# Patient Record
Sex: Male | Born: 1975 | Race: White | Hispanic: No | Marital: Single | Smoking: Light tobacco smoker
Health system: Southern US, Community
[De-identification: ages and names within clinical notes are randomized; demographics above are authoritative.]

---

## 2006-11-09 ENCOUNTER — Emergency Department (HOSPITAL_COMMUNITY): Admission: EM | Admit: 2006-11-09 | Discharge: 2006-11-09 | Payer: Self-pay | Admitting: Family Medicine

## 2018-04-20 ENCOUNTER — Other Ambulatory Visit: Payer: Self-pay | Admitting: Gerontology

## 2018-04-20 ENCOUNTER — Ambulatory Visit (INDEPENDENT_AMBULATORY_CARE_PROVIDER_SITE_OTHER): Payer: Self-pay

## 2018-04-20 DIAGNOSIS — R52 Pain, unspecified: Secondary | ICD-10-CM

## 2018-04-20 DIAGNOSIS — M79671 Pain in right foot: Secondary | ICD-10-CM

## 2018-04-20 DIAGNOSIS — M25571 Pain in right ankle and joints of right foot: Secondary | ICD-10-CM

## 2018-05-07 ENCOUNTER — Other Ambulatory Visit: Payer: Self-pay | Admitting: Emergency Medicine

## 2018-05-07 ENCOUNTER — Ambulatory Visit (INDEPENDENT_AMBULATORY_CARE_PROVIDER_SITE_OTHER): Payer: Self-pay

## 2018-05-07 DIAGNOSIS — M25571 Pain in right ankle and joints of right foot: Secondary | ICD-10-CM

## 2018-05-07 DIAGNOSIS — R52 Pain, unspecified: Secondary | ICD-10-CM

## 2018-05-07 DIAGNOSIS — M79671 Pain in right foot: Secondary | ICD-10-CM

## 2018-05-07 DIAGNOSIS — M7989 Other specified soft tissue disorders: Secondary | ICD-10-CM

## 2019-07-26 IMAGING — DX DG ANKLE COMPLETE 3+V*R*
3 series · 3 of 3 positions shown · non-contrast
Comparison: 04/20/2018

CLINICAL DATA: RIGHT ankle pain and swelling after twist injury 1
week ago

EXAM:
RIGHT ANKLE - COMPLETE 3+ VIEW

[ankle ap]
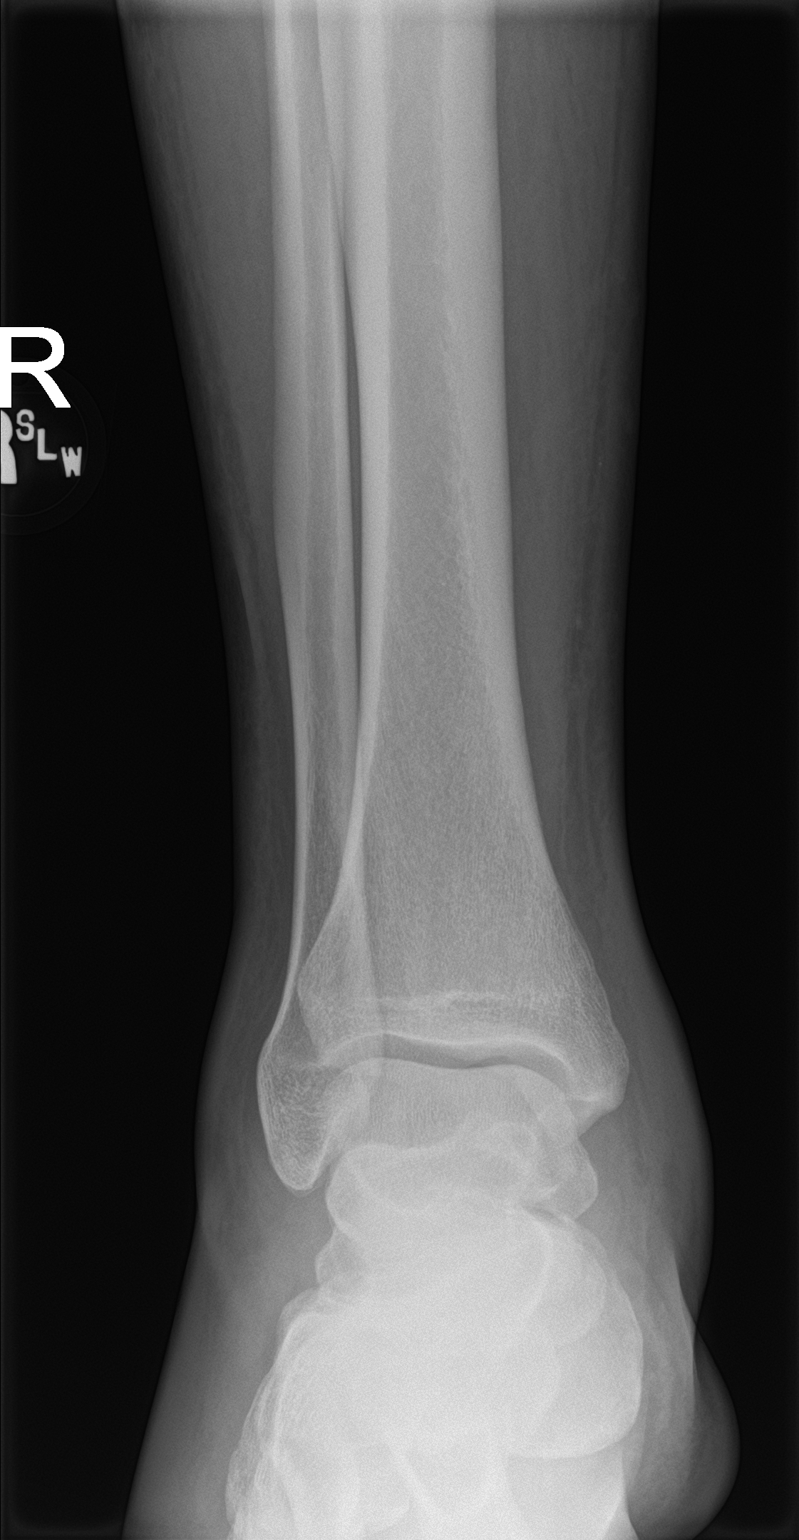

[ankle obl]
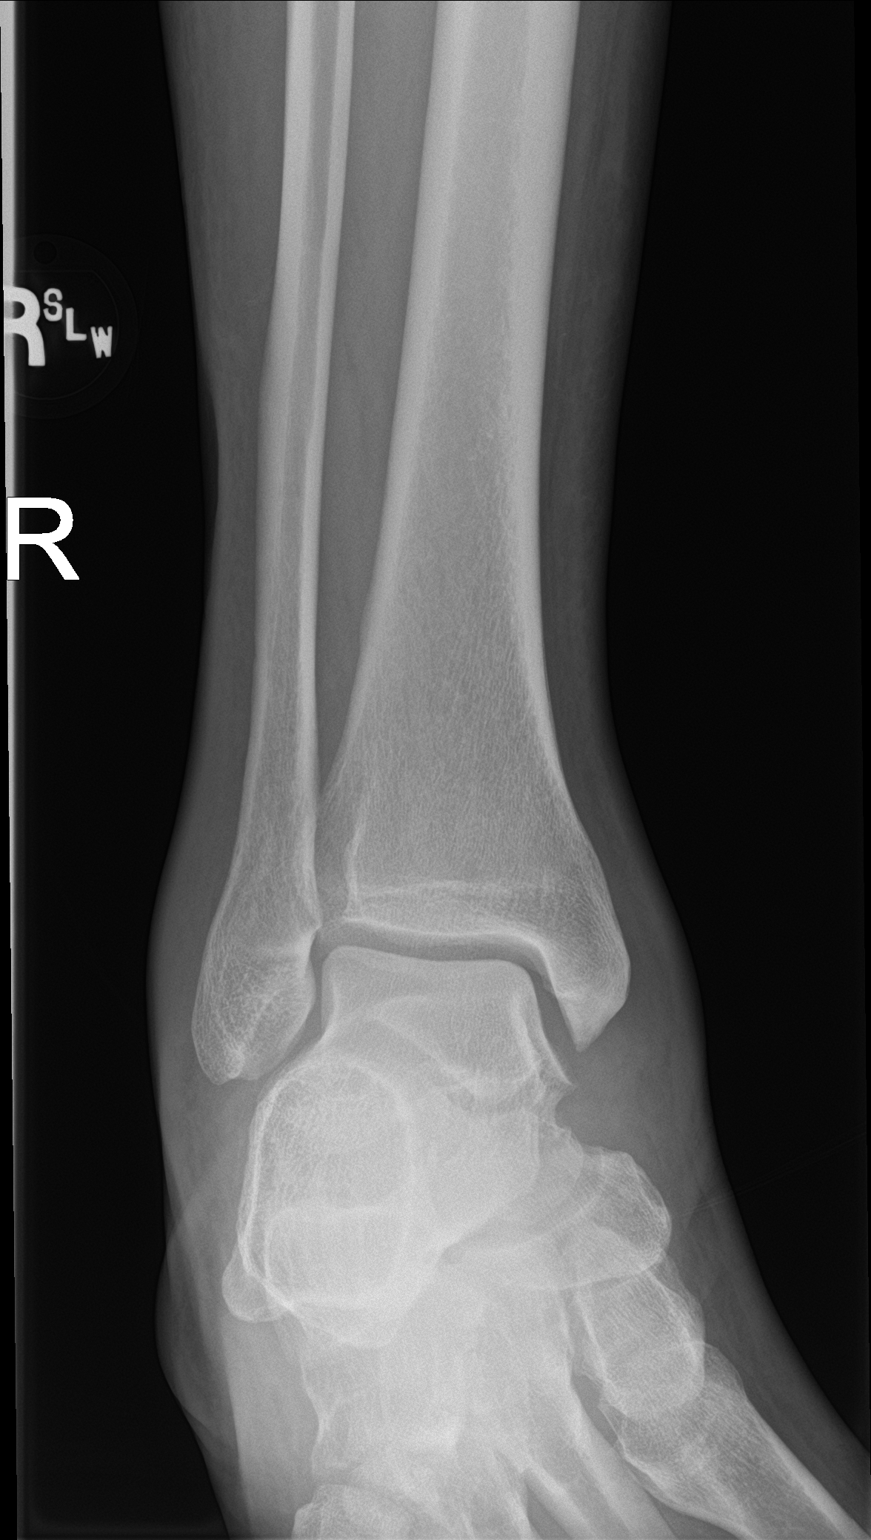

[ankle lat]
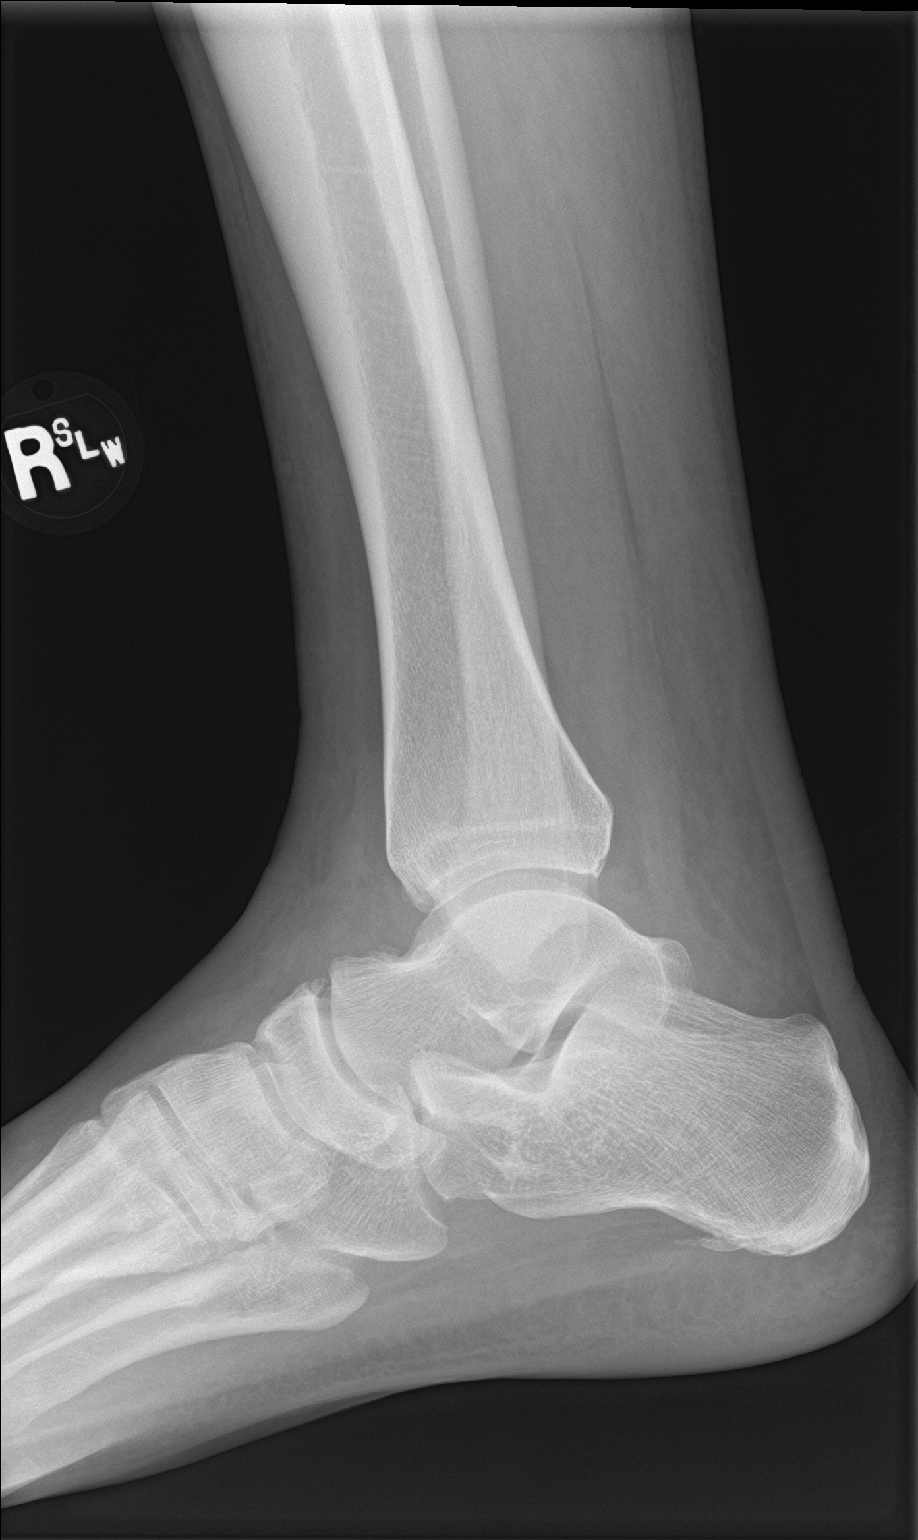

[3 of 3 positions shown; findings below may reference images not displayed]

FINDINGS: Diffuse soft tissue swelling of the RIGHT lower leg and ankle,
increased.

Osseous mineralization normal.

Joint spaces preserved.

Small plantar calcaneal spur.

No acute fracture, dislocation, or bone destruction.
IMPRESSION: No acute osseous abnormalities.

Increased soft tissue swelling since 04/20/2018.

## 2020-05-06 ENCOUNTER — Other Ambulatory Visit: Payer: Self-pay

## 2020-05-06 DIAGNOSIS — Z20822 Contact with and (suspected) exposure to covid-19: Secondary | ICD-10-CM

## 2020-05-08 LAB — NOVEL CORONAVIRUS, NAA: SARS-CoV-2, NAA: NOT DETECTED

## 2020-05-08 LAB — SARS-COV-2, NAA 2 DAY TAT

## 2020-07-28 ENCOUNTER — Encounter: Payer: Self-pay | Admitting: Oncology

## 2020-07-28 ENCOUNTER — Telehealth: Payer: Self-pay | Admitting: Oncology

## 2020-07-28 ENCOUNTER — Other Ambulatory Visit: Payer: Self-pay | Admitting: Oncology

## 2020-07-28 ENCOUNTER — Ambulatory Visit (HOSPITAL_COMMUNITY)
Admission: RE | Admit: 2020-07-28 | Discharge: 2020-07-28 | Disposition: A | Discharge status: Home / Self Care | Payer: Medicaid Other | Source: Ambulatory Visit | Attending: Pulmonary Disease | Admitting: Pulmonary Disease

## 2020-07-28 DIAGNOSIS — U071 COVID-19: Secondary | ICD-10-CM

## 2020-07-28 MED ORDER — EPINEPHRINE 0.3 MG/0.3ML IJ SOAJ: 0.3000 mg | Freq: Once | INTRAMUSCULAR | Status: DC | PRN

## 2020-07-28 MED ORDER — ALBUTEROL SULFATE HFA 108 (90 BASE) MCG/ACT IN AERS: 2.0000 | INHALATION_SPRAY | Freq: Once | RESPIRATORY_TRACT | Status: DC | PRN

## 2020-07-28 MED ORDER — METHYLPREDNISOLONE SODIUM SUCC 125 MG IJ SOLR: 125.0000 mg | Freq: Once | INTRAMUSCULAR | Status: DC | PRN

## 2020-07-28 MED ORDER — FAMOTIDINE IN NACL 20-0.9 MG/50ML-% IV SOLN: 20.0000 mg | Freq: Once | INTRAVENOUS | Status: DC | PRN

## 2020-07-28 MED ORDER — DIPHENHYDRAMINE HCL 50 MG/ML IJ SOLN: 50.0000 mg | Freq: Once | INTRAMUSCULAR | Status: DC | PRN

## 2020-07-28 MED ORDER — SODIUM CHLORIDE 0.9 % IV SOLN: Freq: Once | INTRAVENOUS | Status: AC

## 2020-07-28 MED ORDER — SODIUM CHLORIDE 0.9 % IV SOLN: INTRAVENOUS | Status: DC | PRN

## 2020-07-28 NOTE — Telephone Encounter (Signed)
I connected by phone with  Joseph Cruz to discuss the potential use of an new treatment for mild to moderate COVID-19 viral infection in non-hospitalized patients.   This patient is a age/sex that meets the FDA criteria for Emergency Use Authorization of casirivimab\imdevimab.  Has a (+) direct SARS-CoV-2 viral test result 1. Has mild or moderate COVID-19  2. Is ? 43 years of age and weighs ? 40 kg 3. Is NOT hospitalized due to COVID-19 4. Is NOT requiring oxygen therapy or requiring an increase in baseline oxygen flow rate due to COVID-19 5. Is within 10 days of symptom onset 6. Has at least one of the high risk factor(s) for progression to severe COVID-19 and/or hospitalization as defined in EUA. ? Specific high risk criteria :No past medical history on file. ? Obesity    Symptom onset 07/26/20   I have spoken and communicated the following to the patient or parent/caregiver:   1. FDA has authorized the emergency use of casirivimab\imdevimab for the treatment of mild to moderate COVID-19 in adults and pediatric patients with positive results of direct SARS-CoV-2 viral testing who are 81 years of age and older weighing at least 40 kg, and who are at high risk for progressing to severe COVID-19 and/or hospitalization.   2. The significant known and potential risks and benefits of casirivimab\imdevimab, and the extent to which such potential risks and benefits are unknown.   3. Information on available alternative treatments and the risks and benefits of those alternatives, including clinical trials.   4. Patients treated with casirivimab\imdevimab should continue to self-isolate and use infection control measures (e.g., wear mask, isolate, social distance, avoid sharing personal items, clean and disinfect "high touch" surfaces, and frequent handwashing) according to CDC guidelines.    5. The patient or parent/caregiver has the option to accept or refuse casirivimab\imdevimab .   After  reviewing this information with the patient, The patient agreed to proceed with receiving casirivimab\imdevimab infusion and will be provided a copy of the Fact sheet prior to receiving the infusion.Mignon Pine, AGNP-C [phone] (Infusion Center Hotline)

## 2020-07-28 NOTE — Progress Notes (Signed)
Patient reviewed Fact Sheet for Patients, Parents, and Caregivers for Emergency Use Authorization (EUA) of Bam/Ete for the Treatment of Coronavirus. Patient also reviewed and is agreeable to the estimated cost of treatment. Patient is agreeable to proceed.    

## 2020-07-28 NOTE — Progress Notes (Signed)
  Diagnosis: COVID-19  Physician: Dr. Patrick Wright  Procedure: [facility] Med: bamlanivimab\etesevimab infusion - Provided patient with bamlanimivab\etesevimab fact sheet for patients, parents and caregivers prior to infusion.  Complications: No immediate complications noted.  Discharge: Discharged home   Shaianne Nucci 07/28/2020   

## 2020-07-28 NOTE — Discharge Instructions (Signed)
10 Things You Can Do to Manage Your COVID-19 Symptoms at Home If you have possible or confirmed COVID-19: 1. Stay home from work and school. And stay away from other public places. If you must go out, avoid using any kind of public transportation, ridesharing, or taxis. 2. Monitor your symptoms carefully. If your symptoms get worse, call your healthcare provider immediately. 3. Get rest and stay hydrated. 4. If you have a medical appointment, call the healthcare provider ahead of time and tell them that you have or may have COVID-19. 5. For medical emergencies, call 911 and notify the dispatch personnel that you have or may have COVID-19. 6. Cover your cough and sneezes with a tissue or use the inside of your elbow. 7. Wash your hands often with soap and water for at least 20 seconds or clean your hands with an alcohol-based hand sanitizer that contains at least 60% alcohol. 8. As much as possible, stay in a specific room and away from other people in your home. Also, you should use a separate bathroom, if available. If you need to be around other people in or outside of the home, wear a mask. 9. Avoid sharing personal items with other people in your household, like dishes, towels, and bedding. 10. Clean all surfaces that are touched often, like counters, tabletops, and doorknobs. Use household cleaning sprays or wipes according to the label instructions. cdc.gov/coronavirus 02/13/2019 This information is not intended to replace advice given to you by your health care provider. Make sure you discuss any questions you have with your health care provider. Document Revised: 07/18/2019 Document Reviewed: 07/18/2019 Elsevier Patient Education  2020 [employer] What types of side effects do monoclonal antibody drugs cause?  Common side effects  In general, the more common side effects caused by monoclonal antibody drugs include: . Allergic reactions, such as hives or itching . Flu-like signs and  symptoms, including chills, fatigue, fever, and muscle aches and pains . Nausea, vomiting . Diarrhea . Skin rashes . Low blood pressure   The CDC is recommending patients who receive monoclonal antibody treatments wait at least 90 days before being vaccinated.  Currently, there are no data on the safety and efficacy of mRNA COVID-19 vaccines in persons who received monoclonal antibodies or convalescent plasma as part of COVID-19 treatment. Based on the estimated half-life of such therapies as well as evidence suggesting that reinfection is uncommon in the 90 days after initial infection, vaccination should be deferred for at least 90 days, as a precautionary measure until additional information becomes available, to avoid interference of the antibody treatment with vaccine-induced immune responses. If you have any questions or concerns after the infusion please call the Advanced Practice Provider on call at [phone]. This number is ONLY intended for your use regarding questions or concerns about the infusion post-treatment side-effects.  Please do not provide this number to others for use. For return to work notes please contact your primary care provider.   If someone you know is interested in receiving treatment please have them call the COVID hotline at [phone].   

## 2020-10-30 ENCOUNTER — Emergency Department (HOSPITAL_COMMUNITY)
Admission: EM | Admit: 2020-10-30 | Discharge: 2020-10-31 | Disposition: A | Discharge status: Home / Self Care | Payer: Medicaid Other | Attending: Emergency Medicine | Admitting: Emergency Medicine

## 2020-10-30 ENCOUNTER — Other Ambulatory Visit: Payer: Self-pay

## 2020-10-30 ENCOUNTER — Encounter (HOSPITAL_COMMUNITY): Payer: Self-pay | Admitting: Emergency Medicine

## 2020-10-30 DIAGNOSIS — F1020 Alcohol dependence, uncomplicated: Secondary | ICD-10-CM

## 2020-10-30 DIAGNOSIS — F1010 Alcohol abuse, uncomplicated: Secondary | ICD-10-CM | POA: Diagnosis present

## 2020-10-30 DIAGNOSIS — N6310 Unspecified lump in the right breast, unspecified quadrant: Secondary | ICD-10-CM

## 2020-10-30 LAB — RAPID URINE DRUG SCREEN, HOSP PERFORMED
Amphetamines: NOT DETECTED
Barbiturates: NOT DETECTED
Benzodiazepines: NOT DETECTED
Cocaine: NOT DETECTED
Opiates: NOT DETECTED
Tetrahydrocannabinol: POSITIVE — AB

## 2020-10-30 LAB — COMPREHENSIVE METABOLIC PANEL
ALT: 49 U/L — ABNORMAL HIGH (ref 0–44)
AST: 86 U/L — ABNORMAL HIGH (ref 15–41)
Albumin: 3.7 g/dL (ref 3.5–5.0)
Alkaline Phosphatase: 111 U/L (ref 38–126)
Anion gap: 14 (ref 5–15)
BUN: 7 mg/dL (ref 6–20)
CO2: 25 mmol/L (ref 22–32)
Calcium: 8.7 mg/dL — ABNORMAL LOW (ref 8.9–10.3)
Chloride: 98 mmol/L (ref 98–111)
Creatinine, Ser: 0.75 mg/dL (ref 0.61–1.24)
GFR, Estimated: >60 mL/min (ref >60)
Glucose, Bld: 190 mg/dL — ABNORMAL HIGH (ref 70–99)
Potassium: 3.1 mmol/L — ABNORMAL LOW (ref 3.5–5.1)
Sodium: 137 mmol/L (ref 135–145)
Total Bilirubin: 0.8 mg/dL (ref 0.3–1.2)
Total Protein: 7.5 g/dL (ref 6.5–8.1)

## 2020-10-30 LAB — CBC
HCT: 38.1 % — ABNORMAL LOW (ref 39.0–52.0)
Hemoglobin: 12.8 g/dL — ABNORMAL LOW (ref 13.0–17.0)
MCH: 33.4 pg (ref 26.0–34.0)
MCHC: 33.6 g/dL (ref 30.0–36.0)
MCV: 99.5 fL (ref 80.0–100.0)
Platelets: 165 10*3/uL (ref 150–400)
RBC: 3.83 MIL/uL — ABNORMAL LOW (ref 4.22–5.81)
RDW: 13.2 % (ref 11.5–15.5)
WBC: 7.4 10*3/uL (ref 4.0–10.5)
nRBC: 0 % (ref 0.0–0.2)

## 2020-10-30 LAB — ETHANOL: Alcohol, Ethyl (B): 397 mg/dL (ref <10)

## 2020-10-30 NOTE — ED Triage Notes (Signed)
Patient requesting ETOH detox treatment  , last drink today , no hallucinations , denies SI or HI .

## 2020-10-31 MED ORDER — POTASSIUM CHLORIDE CRYS ER 20 MEQ PO TBCR
40.0000 meq | EXTENDED_RELEASE_TABLET | Freq: Once | ORAL | Status: AC
  Administered 2020-10-31: 40 meq via ORAL
  Filled 2020-10-31: qty 2

## 2020-10-31 MED ORDER — CHLORDIAZEPOXIDE HCL 25 MG PO CAPS
ORAL_CAPSULE | ORAL | 0 refills | Status: DC
Start: 2020-10-31 — End: 2020-10-31

## 2020-10-31 MED ORDER — LORAZEPAM 1 MG PO TABS
1.0000 mg | ORAL_TABLET | Freq: Once | ORAL | Status: AC
  Administered 2020-10-31: 1 mg via ORAL
  Filled 2020-10-31: qty 1

## 2020-10-31 MED ORDER — CHLORDIAZEPOXIDE HCL 25 MG PO CAPS: ORAL_CAPSULE | ORAL | 0 refills | Status: DC

## 2020-10-31 NOTE — ED Notes (Signed)
Pt called x 3 no answer 

## 2020-10-31 NOTE — Discharge Instructions (Addendum)
See your Physician to be scheduled for evaluation of breast mass.

## 2020-10-31 NOTE — ED Notes (Signed)
ED Provider at bedside. 

## 2020-10-31 NOTE — ED Provider Notes (Signed)
[facility] EMERGENCY DEPARTMENT Provider Note   CSN: [id number] Arrival date & time: 10/30/20  1850     History Chief Complaint  Patient presents with  . Alcohol Problem    Detox Treatment    Joseph Cruz is a 45 y.o. male.  The history is provided by the patient. No language interpreter was used.  Alcohol Problem This is a recurrent problem. The problem occurs constantly. The problem has been gradually worsening. Nothing aggravates the symptoms. Nothing relieves the symptoms. He has tried nothing for the symptoms. The treatment provided no relief.  Pt went to behavioral health and was told he was to intoxicated to be evaluated and was sent here.  Pt wants to go to a residental  treatment program     History reviewed. No pertinent past medical history.  There are no problems to display for this patient.   History reviewed. No pertinent surgical history.     No family history on file.  Social History   Tobacco Use  . Smoking status: Unknown If Ever Smoked  Substance Use Topics  . Alcohol use: Yes  . Drug use: Never    Home Medications Prior to Admission medications   Not on File    Allergies    Patient has no known allergies.  Review of Systems   Review of Systems  All other systems reviewed and are negative.   Physical Exam Updated Vital Signs BP 125/88 (BP Location: Right Arm)   Pulse 85   Temp 97.6 F (36.4 C) (Oral)   Resp 18   SpO2 99%   Physical Exam Vitals and nursing note reviewed.  Constitutional:      Appearance: He is well-developed.     Comments: shaky  HENT:     Head: Normocephalic and atraumatic.  Eyes:     Conjunctiva/sclera: Conjunctivae normal.  Cardiovascular:     Rate and Rhythm: Normal rate and regular rhythm.     Heart sounds: No murmur heard.   Pulmonary:     Effort: Pulmonary effort is normal. No respiratory distress.     Breath sounds: Normal breath sounds.  Abdominal:     Palpations: Abdomen  is soft.     Tenderness: There is no abdominal tenderness.  Musculoskeletal:        General: Normal range of motion.     Cervical back: Neck supple.  Skin:    General: Skin is warm and dry.  Neurological:     General: No focal deficit present.     Mental Status: He is alert.  Psychiatric:        Mood and Affect: Mood normal.     ED Results / Procedures / Treatments   Labs (all labs ordered are listed, but only abnormal results are displayed) Labs Reviewed  COMPREHENSIVE METABOLIC PANEL - Abnormal; Notable for the following components:      Result Value   Potassium 3.1 (*)    Glucose, Bld 190 (*)    Calcium 8.7 (*)    AST 86 (*)    ALT 49 (*)    All other components within normal limits  ETHANOL - Abnormal; Notable for the following components:   Alcohol, Ethyl (B) 397 (*)    All other components within normal limits  CBC - Abnormal; Notable for the following components:   RBC 3.83 (*)    Hemoglobin 12.8 (*)    HCT 38.1 (*)    All other components within normal limits  RAPID URINE  DRUG SCREEN, HOSP PERFORMED - Abnormal; Notable for the following components:   Tetrahydrocannabinol POSITIVE (*)    All other components within normal limits    EKG None  Radiology No results found.  Procedures Procedures   Medications Ordered in ED Medications - No data to display  ED Course  I have reviewed the triage vital signs and the nursing notes.  Pertinent labs & imaging results that were available during my care of the patient were reviewed by me and considered in my medical decision making (see chart for details).    MDM Rules/Calculators/A&P                          MDM:  Pt has low potassium   Pt given potassium 40mg  and ativan 1 mg  Po Final Clinical Impression(s) / ED Diagnoses Final diagnoses:  Alcohol abuse  Uncomplicated alcohol dependence (HCC)    Rx / DC Orders ED Discharge Orders    None    An After Visit Summary was printed and given to the  patient.    , Elson Areas 10/31/20 11/02/20    [id number], MD 11/01/20 [phone]

## 2020-11-12 ENCOUNTER — Ambulatory Visit (HOSPITAL_COMMUNITY): Payer: Medicaid Other | Admitting: Behavioral Health

## 2020-11-18 ENCOUNTER — Other Ambulatory Visit: Payer: Self-pay

## 2020-11-18 ENCOUNTER — Ambulatory Visit (INDEPENDENT_AMBULATORY_CARE_PROVIDER_SITE_OTHER): Payer: Medicaid Other | Admitting: Behavioral Health

## 2020-11-18 DIAGNOSIS — F1020 Alcohol dependence, uncomplicated: Secondary | ICD-10-CM

## 2020-11-25 ENCOUNTER — Other Ambulatory Visit: Payer: Self-pay | Admitting: Endocrinology

## 2020-11-25 DIAGNOSIS — N6341 Unspecified lump in right breast, subareolar: Secondary | ICD-10-CM

## 2020-11-30 ENCOUNTER — Other Ambulatory Visit: Payer: Self-pay

## 2020-11-30 ENCOUNTER — Other Ambulatory Visit (HOSPITAL_COMMUNITY): Payer: Self-pay | Admitting: Medical

## 2020-11-30 ENCOUNTER — Ambulatory Visit (HOSPITAL_COMMUNITY): Payer: Medicaid Other | Admitting: Behavioral Health

## 2020-11-30 DIAGNOSIS — Z794 Long term (current) use of insulin: Secondary | ICD-10-CM

## 2020-11-30 DIAGNOSIS — F1020 Alcohol dependence, uncomplicated: Secondary | ICD-10-CM

## 2020-11-30 DIAGNOSIS — Z62819 Personal history of unspecified abuse in childhood: Secondary | ICD-10-CM

## 2020-11-30 DIAGNOSIS — E0801 Diabetes mellitus due to underlying condition with hyperosmolarity with coma: Secondary | ICD-10-CM

## 2020-11-30 DIAGNOSIS — K860 Alcohol-induced chronic pancreatitis: Secondary | ICD-10-CM

## 2020-11-30 DIAGNOSIS — Z6372 Alcoholism and drug addiction in family: Secondary | ICD-10-CM

## 2020-11-30 MED ORDER — TRAZODONE HCL 100 MG PO TABS: 100.0000 mg | ORAL_TABLET | Freq: Every day | ORAL | 2 refills | Status: DC

## 2020-11-30 MED ORDER — VITAMIN-B COMPLEX PO TABS: 1.0000 | ORAL_TABLET | Freq: Every day | ORAL | 1 refills | Status: AC

## 2020-11-30 MED ORDER — BACLOFEN 10 MG PO TABS: 10.0000 mg | ORAL_TABLET | Freq: Three times a day (TID) | ORAL | 1 refills | Status: AC

## 2020-12-02 ENCOUNTER — Encounter (HOSPITAL_COMMUNITY): Payer: Self-pay | Admitting: Behavioral Health

## 2020-12-02 ENCOUNTER — Ambulatory Visit (INDEPENDENT_AMBULATORY_CARE_PROVIDER_SITE_OTHER): Payer: Medicaid Other | Admitting: Behavioral Health

## 2020-12-02 DIAGNOSIS — F1020 Alcohol dependence, uncomplicated: Secondary | ICD-10-CM

## 2020-12-02 DIAGNOSIS — F1024 Alcohol dependence with alcohol-induced mood disorder: Secondary | ICD-10-CM

## 2020-12-02 DIAGNOSIS — E0800 Diabetes mellitus due to underlying condition with hyperosmolarity without nonketotic hyperglycemic-hyperosmolar coma (NKHHC): Secondary | ICD-10-CM

## 2020-12-02 DIAGNOSIS — Z794 Long term (current) use of insulin: Secondary | ICD-10-CM

## 2020-12-02 DIAGNOSIS — K860 Alcohol-induced chronic pancreatitis: Secondary | ICD-10-CM

## 2020-12-02 DIAGNOSIS — I10 Essential (primary) hypertension: Secondary | ICD-10-CM

## 2020-12-02 NOTE — Addendum Note (Signed)
Addended by: Court Joy on: 12/02/2020 06:01 PM   Modules accepted: Level of Service
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
## 2020-12-02 NOTE — Group Note (Signed)
Group Topic: Adult Children of Alcoholics  Group Date: 12/02/2020 Start Time:  9:00 AM End Time: 12:00 PM Facilitators: Mamie Nick, Counselor  Department: [facility]  Number of Participants: 4  Group Focus: acceptance, affirmation, check in, coping skills, depression, family, forgiveness, loss/grief issues, and relapse prevention Treatment Modality:  Cognitive Behavioral Therapy Interventions utilized were clarification, confrontation, and exploration Purpose: enhance coping skills, explore maladaptive thinking, express feelings, express irrational fears, improve communication skills, increase insight, regain self-worth, reinforce self-care, relapse prevention strategies, and trigger / craving management   Name: Joseph Cruz Date of Birth: 24-Aug-1975  MR: [id number]    Level of Participation: active Quality of Participation: attentive, cooperative, motivated and offered feedback Interactions with others: gave feedback Mood/Affect: appropriate Triggers (if applicable): N/A Cognition: coherent/clear Progress: Gaining insight Response: Clt checked in by sharing that his peaks are "my daughters came back home from the beach with their grandparents and we spent time together eating dinner and talking". Clt reports his valley to be "yesterday was rough; I had high anxiety and I wanted to drink so bad". Clt processed the things his addiction to alcohol has costed him - "a DUI and almost cost me my wife and kids". Plan: follow-up needed  Patients Problems:  There are no problems to display for this patient.    Family Program: Family present? No   Name of family member(s): N/A  UDS collected: No Results: N/A  AA/NA attended?: No  Sponsor?: No

## 2020-12-02 NOTE — Progress Notes (Signed)
Patient ID: Joseph Cruz, male   DOB: January 20, 1976, 45 y.o.   MRN: [id number] Pt was to bring meds but forgot. Continues to experience significant cravings and has not gotten Baclofen-due to pick up today.Anxiety level high in association with cravings. FU Friday with meds.Deep breathing/CBT  Reviewed.

## 2020-12-04 ENCOUNTER — Ambulatory Visit (HOSPITAL_COMMUNITY): Payer: Medicaid Other | Admitting: Behavioral Health

## 2020-12-07 ENCOUNTER — Other Ambulatory Visit: Payer: Self-pay

## 2020-12-07 ENCOUNTER — Ambulatory Visit (INDEPENDENT_AMBULATORY_CARE_PROVIDER_SITE_OTHER): Payer: Medicaid Other | Admitting: Behavioral Health

## 2020-12-07 ENCOUNTER — Encounter (HOSPITAL_COMMUNITY): Payer: Self-pay | Admitting: Medical

## 2020-12-07 DIAGNOSIS — Z6372 Alcoholism and drug addiction in family: Secondary | ICD-10-CM

## 2020-12-07 DIAGNOSIS — I10 Essential (primary) hypertension: Secondary | ICD-10-CM

## 2020-12-07 DIAGNOSIS — Z794 Long term (current) use of insulin: Secondary | ICD-10-CM

## 2020-12-07 DIAGNOSIS — E0800 Diabetes mellitus due to underlying condition with hyperosmolarity without nonketotic hyperglycemic-hyperosmolar coma (NKHHC): Secondary | ICD-10-CM

## 2020-12-07 DIAGNOSIS — F1024 Alcohol dependence with alcohol-induced mood disorder: Secondary | ICD-10-CM

## 2020-12-07 DIAGNOSIS — F1020 Alcohol dependence, uncomplicated: Secondary | ICD-10-CM

## 2020-12-07 DIAGNOSIS — K860 Alcohol-induced chronic pancreatitis: Secondary | ICD-10-CM

## 2020-12-07 MED ORDER — SERTRALINE HCL 100 MG PO TABS: 100.0000 mg | ORAL_TABLET | Freq: Every day | ORAL | 2 refills | Status: DC

## 2020-12-07 NOTE — Group Note (Signed)
Group Topic: Boundaries  Group Date: 12/07/2020 Start Time:  9:00 AM End Time: 12:00 PM Facilitators: Mamie Nick, Counselor  Department: [facility]  Number of Participants: 3  Group Focus: affirmation, anxiety, clarity of thought, communication, coping skills, depression, dual diagnosis, family, feeling awareness/expression, goals/reality orientation, healthy friendships, and relapse prevention Treatment Modality:  Cognitive Behavioral Therapy Interventions utilized were clarification, confrontation, exploration, and other AA Speaker Meeting Purpose: enhance coping skills, explore maladaptive thinking, express feelings, express irrational fears, improve communication skills, increase insight, regain self-worth, reinforce self-care, relapse prevention strategies, and trigger / craving management   Name: Joseph Cruz Date of Birth: 1975/09/09  MR: [id number]    Level of Participation: active Quality of Participation: attentive, cooperative and offered feedback Interactions with others: gave feedback Mood/Affect: appropriate Triggers (if applicable): N/A Cognition: coherent/clear Progress: Gaining insight Response: Clt checked-in by sharing his peaks and valleys. Peaks: "My daughter is awake and talking". Clt shared that his daughter is doing well after her recent open heart surgery and he reports this brings him joy. Clt also shared that he has been maintaining his sobriety by not having money on his person. Calleys: "I'm still having cravings and still waking up wishing I had a drink". He shared that his sleep patterns have improved with the help of his prescribed medication. Clt word for the day is "motivated".  Plan: follow-up needed  Patients Problems:  There are no problems to display for this patient.    Family Program: Family present? No   Name of family member(s): N/A  UDS collected: No Results: N/A  AA/NA attended?: No  Sponsor?: No

## 2020-12-07 NOTE — Progress Notes (Addendum)
[facility] Follow-up Outpatient CDIOP Date: 12/07/2020  Admission Date:11/30/2020  Sobriety date: 11/01/2020  Subjective: ""I'm good. Worried about my daughter (at [facility] after open heart surgery)  HPI : CD IOP INITIAL (1 week) FU S/P admission [facility]IOP Pt having no problems with meds .Claims no interest in alcohol.   Review of Systems: Psychiatric: Agitation: No Hallucination: No Depressed Mood: Yes much better Insomnia: No Hypersomnia: No Altered Concentration: No Feels Worthless: No Grandiose Ideas: No Belief In Special Powers: No New/Increased Substance Abuse: No Compulsions: No  Neurologic: Headache: No Seizure: No Paresthesias: No  Current Medications: Alcohol Wipes 70 % Pads Apply topically.  atorvastatin 40 MG tablet Commonly known as: LIPITOR Take 40 mg by mouth daily.  B-D UF III MINI PEN NEEDLES 31G X 5 MM Misc Generic drug: Insulin Pen Needle SMARTSIG:1 Each SUB-Q 4 Times Daily  baclofen 10 MG tablet Commonly known as: LIORESAL Take 1 tablet (10 mg total) by mouth 3 (three) times daily.  carvedilol 6.25 MG tablet Commonly known as: COREG Take 6.25 mg by mouth 2 (two) times daily with a meal.  empagliflozin 10 MG Tabs tablet Commonly known as: JARDIANCE Take 10 mg by mouth daily with breakfast.  ferrous sulfate 324 MG Tbec Take 324 mg by mouth daily.  FreeStyle Libre 14 Day Reader Hardie Pulley Use 1 Device as directed  insulin lispro 100 UNIT/ML KwikPen Commonly known as: HUMALOG Inject 6 Units into the skin 3 (three) times daily.  Lantus SoloStar 100 UNIT/ML Solostar Pen Generic drug: insulin glargine Inject 22 Units into the skin daily.  lisinopril 10 MG tablet Commonly known as: ZESTRIL Take 10 mg by mouth daily.  metFORMIN 500 MG tablet Commonly known as: GLUCOPHAGE Take 500 mg by mouth 2 (two) times daily with a meal.  Multi-Vitamin tablet Take 1 tablet by mouth daily.  Precision QID Test test strip Generic drug: glucose blood Use 1 each  (1 strip total) 3 (three) times daily  Procysbi 300 MG Pack Generic drug: Cysteamine Bitartrate Use 1 each 3 (three) times daily Product selection permitted according to insurance preference. E11.9 Type 2 diabetes mellitus  sertraline 100 MG tablet Commonly known as: Zoloft Take 1 tablet (100 mg total) by mouth daily.  traZODone 100 MG tablet Commonly known as: DESYREL Take 1 tablet (100 mg total) by mouth at bedtime.  vitamin C 1000 MG tablet Take 1,000 mg by mouth daily.  Vitamin-B Complex Tabs Take 1 tablet by mouth daily.    Mental Status Examination  Appearance: Alert: Yes Attention: good  Cooperative: Yes Eye Contact: Good Speech: Clear and coherent Psychomotor Activity: Normal Memory/Concentration: Normal/intact Oriented: person, place, time/date and situation Mood: Euthymic Affect: Appropriate and Congruent Thought Processes and Associations: Coherent and Intact Fund of Knowledge: Good Thought Content: WDL Insight: Good Judgement: Good  UDS:NA  PDMP:03/05/2019 Lortab 5 #20  Diagnosis:  Alcohol dependence with alcohol-induced mood disorder (HCC) Chronic pancreatitis due to chronic alcoholism (HCC) Diabetes mellitus due to underlying condition with hyperosmolarity withoutcoma, with long-term current use of insulin (HCC) Essential hypertension Alcohol use disorder, severe, dependence (HCC) Diabetes mellitus due to underlying condition with hyperosmolarity and coma,with long-term current use of insulin (HCC) Alcoholism and drug addiction in family Dysfunctional family due to alcoholism  Assessment:Beginning IOP  Treatment Plan:Per SAIOP Protocol-Pt to FU with specialists for SDiabetes/CHF. Psych meds here while in IOP FU 2 weeks  Maryjean Morn, PA-C  ADDENDUM 02/25/2021-  Pt left group by ambulance 12/16/2020 c/o of ETOH W/D after resuming consumption  of alcohol 12/10/2020 and never returned.

## 2020-12-07 NOTE — Addendum Note (Signed)
Addended by: Court Joy on: 12/07/2020 10:57 AM   Modules accepted: Orders

## 2020-12-09 ENCOUNTER — Ambulatory Visit (HOSPITAL_COMMUNITY): Payer: Medicaid Other | Admitting: Behavioral Health

## 2020-12-11 ENCOUNTER — Ambulatory Visit (HOSPITAL_COMMUNITY): Payer: Medicaid Other | Admitting: Behavioral Health

## 2020-12-14 ENCOUNTER — Ambulatory Visit (HOSPITAL_COMMUNITY): Payer: Medicaid Other | Admitting: Behavioral Health

## 2020-12-16 ENCOUNTER — Other Ambulatory Visit: Payer: Self-pay

## 2020-12-16 ENCOUNTER — Emergency Department (HOSPITAL_BASED_OUTPATIENT_CLINIC_OR_DEPARTMENT_OTHER)
Admission: EM | Admit: 2020-12-16 | Discharge: 2020-12-16 | Disposition: A | Discharge status: Home / Self Care | Payer: Medicaid Other | Attending: Emergency Medicine | Admitting: Emergency Medicine

## 2020-12-16 ENCOUNTER — Ambulatory Visit (INDEPENDENT_AMBULATORY_CARE_PROVIDER_SITE_OTHER): Payer: Medicaid Other | Admitting: Behavioral Health

## 2020-12-16 ENCOUNTER — Encounter (HOSPITAL_BASED_OUTPATIENT_CLINIC_OR_DEPARTMENT_OTHER): Payer: Self-pay | Admitting: Emergency Medicine

## 2020-12-16 DIAGNOSIS — F17290 Nicotine dependence, other tobacco product, uncomplicated: Secondary | ICD-10-CM | POA: Insufficient documentation

## 2020-12-16 DIAGNOSIS — F1020 Alcohol dependence, uncomplicated: Secondary | ICD-10-CM

## 2020-12-16 DIAGNOSIS — R61 Generalized hyperhidrosis: Secondary | ICD-10-CM | POA: Insufficient documentation

## 2020-12-16 DIAGNOSIS — F1024 Alcohol dependence with alcohol-induced mood disorder: Secondary | ICD-10-CM

## 2020-12-16 DIAGNOSIS — Z7984 Long term (current) use of oral hypoglycemic drugs: Secondary | ICD-10-CM | POA: Diagnosis not present

## 2020-12-16 DIAGNOSIS — R112 Nausea with vomiting, unspecified: Secondary | ICD-10-CM | POA: Diagnosis not present

## 2020-12-16 DIAGNOSIS — Z794 Long term (current) use of insulin: Secondary | ICD-10-CM | POA: Insufficient documentation

## 2020-12-16 LAB — COMPREHENSIVE METABOLIC PANEL
ALT: 36 U/L (ref 0–44)
AST: 43 U/L — ABNORMAL HIGH (ref 15–41)
Albumin: 4.4 g/dL (ref 3.5–5.0)
Alkaline Phosphatase: 134 U/L — ABNORMAL HIGH (ref 38–126)
Anion gap: 18 — ABNORMAL HIGH (ref 5–15)
BUN: 13 mg/dL (ref 6–20)
CO2: 27 mmol/L (ref 22–32)
Calcium: 9.9 mg/dL (ref 8.9–10.3)
Chloride: 90 mmol/L — ABNORMAL LOW (ref 98–111)
Creatinine, Ser: 1.14 mg/dL (ref 0.61–1.24)
GFR, Estimated: >60 mL/min (ref >60)
Glucose, Bld: 227 mg/dL — ABNORMAL HIGH (ref 70–99)
Potassium: 3.8 mmol/L (ref 3.5–5.1)
Sodium: 135 mmol/L (ref 135–145)
Total Bilirubin: 1.3 mg/dL — ABNORMAL HIGH (ref 0.3–1.2)
Total Protein: 8 g/dL (ref 6.5–8.1)

## 2020-12-16 LAB — CBC WITH DIFFERENTIAL/PLATELET
Abs Immature Granulocytes: 0.02 10*3/uL (ref 0.00–0.07)
Basophils Absolute: 0.1 10*3/uL (ref 0.0–0.1)
Basophils Relative: 1 %
Eosinophils Absolute: 0.1 10*3/uL (ref 0.0–0.5)
Eosinophils Relative: 1 %
HCT: 45 % (ref 39.0–52.0)
Hemoglobin: 15.8 g/dL (ref 13.0–17.0)
Immature Granulocytes: 0 %
Lymphocytes Relative: 15 %
Lymphs Abs: 1.3 10*3/uL (ref 0.7–4.0)
MCH: 33.4 pg (ref 26.0–34.0)
MCHC: 35.1 g/dL (ref 30.0–36.0)
MCV: 95.1 fL (ref 80.0–100.0)
Monocytes Absolute: 0.5 10*3/uL (ref 0.1–1.0)
Monocytes Relative: 5 %
Neutro Abs: 7 10*3/uL (ref 1.7–7.7)
Neutrophils Relative %: 78 %
Platelets: 305 10*3/uL (ref 150–400)
RBC: 4.73 MIL/uL (ref 4.22–5.81)
RDW: 13.5 % (ref 11.5–15.5)
WBC: 9 10*3/uL (ref 4.0–10.5)
nRBC: 0 % (ref 0.0–0.2)

## 2020-12-16 LAB — ETHANOL: Alcohol, Ethyl (B): <10 mg/dL (ref <10)

## 2020-12-16 LAB — LIPASE, BLOOD: Lipase: <10 U/L — ABNORMAL LOW (ref 11–51)

## 2020-12-16 MED ORDER — ONDANSETRON 4 MG PO TBDP: 4.0000 mg | ORAL_TABLET | Freq: Three times a day (TID) | ORAL | 0 refills | Status: DC | PRN

## 2020-12-16 MED ORDER — SODIUM CHLORIDE 0.9 % IV BOLUS
1000.0000 mL | Freq: Once | INTRAVENOUS | Status: AC
  Administered 2020-12-16: 1000 mL via INTRAVENOUS

## 2020-12-16 MED ORDER — ONDANSETRON HCL 4 MG/2ML IJ SOLN
4.0000 mg | Freq: Once | INTRAMUSCULAR | Status: AC
  Administered 2020-12-16: 4 mg via INTRAVENOUS
  Filled 2020-12-16: qty 2

## 2020-12-16 NOTE — Discharge Instructions (Addendum)
You were seen in the emergency department for nausea vomiting and feeling lightheaded.  Your symptoms improved with some fluids and nausea medication.  Your lab work did not show an obvious explanation for your symptoms.  We are prescribing some nausea medication to use at home.  Please try to stay well-hydrated.  Follow-up with your doctor.  Return to the emergency department for any worsening or concerning symptoms

## 2020-12-16 NOTE — Group Note (Signed)
Group Topic: Identifying Triggers  Group Date: 12/16/2020 Start Time:  9:00 AM End Time: 12:00 PM Facilitators: Mamie Nick, Counselor  Department: [facility]  Number of Participants: 3  Group Focus: affirmation, anxiety, communication, coping skills, depression, family, feeling awareness/expression, impulsivity, relapse prevention, and safety plan Treatment Modality:  Cognitive Behavioral Therapy Interventions utilized were clarification, confrontation, exploration, and patient education Purpose: enhance coping skills, explore maladaptive thinking, express feelings, express irrational fears, improve communication skills, increase insight, regain self-worth, reinforce self-care, relapse prevention strategies, and trigger / craving management   Name: Isiaih Hollenbach Date of Birth: 1975/12/08  MR: [id number]    Level of Participation: minimal Quality of Participation: Experiencing Active Withdrawals Interactions with others: Experiencing Active Withdrawals Mood/Affect: depressed Triggers (if applicable): N/A Cognition: not focused Progress: None Response: Clt checked in by sharing that his daughter may be coming home from the hospital next week. He reports being happy about this news. After being asked by group leader, clt reported that he last drank alcohol "on Thursday (4/28)". On obserservation, clt did not appear to be feeling well. He was sweating profusely and complained of feeling nauseous. Group leader asked clt if he wouldn't mind having the nursing staff collect his vitals - clt agreed. Clt's BP was reading 150/103 (sitting). Group leader informed clt that for safety reasons it would be best that he did not return to group and was seen emergent. Clinician and front desk staff attempted to contact clt's wife - no answer. Front desk and nursing staff then contacted [facility] to transport pt to the local emergency room. While waiting for [facility], clt was vomiting  in the bathroom at [facility] - outpatient. Plan: patient will be encouraged to present to local emergency room for active withdrawals.  Patients Problems:  There are no problems to display for this patient.    Family Program: Family present? No   Name of family member(s): N/A  UDS collected: No Results: N/A  AA/NA attended?: No  Sponsor?: No

## 2020-12-16 NOTE — ED Provider Notes (Signed)
[facility] EMERGENCY DEPT Provider Note   CSN: [id number] Arrival date & time: 12/16/20  1024     History Chief Complaint  Patient presents with  . Emesis    Joseph Cruz is a 45 y.o. male.  He is brought in by ambulance from a therapy session at the [facility].  He said while he was there he acutely felt nauseous with sweaty and vomited a few times.  Nonbloody.  No abdominal pain or diarrhea.  No fevers chills cough shortness of breath.  He has recently cut back on his alcohol intake.  He also started a few new meds including trazodone, baclofen, and vitamins which he took this morning on an empty stomach.  The history is provided by the patient.  Emesis Severity:  Moderate Duration:  1 hour Timing:  Sporadic Quality:  Bilious material Progression:  Improving Chronicity:  New Recent urination:  Normal Relieved by:  Nothing Worsened by:  Nothing Ineffective treatments:  None tried Associated symptoms: no abdominal pain, no chills, no cough, no diarrhea, no fever, no headaches and no sore throat   Risk factors: no sick contacts and no suspect food intake        History reviewed. No pertinent past medical history.  There are no problems to display for this patient.   History reviewed. No pertinent surgical history.     History reviewed. No pertinent family history.  Social History   Tobacco Use  . Smoking status: Light Tobacco Smoker    Types: Cigars  . Smokeless tobacco: Never Used  Substance Use Topics  . Alcohol use: Yes  . Drug use: Never    Home Medications Prior to Admission medications   Medication Sig Start Date End Date Taking? Authorizing Provider  Alcohol Swabs (ALCOHOL WIPES) 70 % PADS Apply topically. 01/05/20   [provider]  Ascorbic Acid (VITAMIN C) 1000 MG tablet Take 1,000 mg by mouth daily.    [provider]  atorvastatin (LIPITOR) 40 MG tablet Take 40 mg by mouth daily.    [provider]  B Complex  Vitamins (VITAMIN-B COMPLEX) TABS Take 1 tablet by mouth daily. 11/30/20 05/29/21  Court Joy, PA-C  B-D UF III MINI PEN NEEDLES 31G X 5 MM MISC SMARTSIG:1 Each SUB-Q 4 Times Daily 10/29/20   [provider]  baclofen (LIORESAL) 10 MG tablet Take 1 tablet (10 mg total) by mouth 3 (three) times daily. 11/30/20 11/30/21  Court Joy, PA-C  carvedilol (COREG) 6.25 MG tablet Take 6.25 mg by mouth 2 (two) times daily with a meal.    [provider]  Continuous Blood Gluc Receiver (FREESTYLE LIBRE 14 DAY READER) DEVI Use 1 Device as directed 06/17/20   [provider]  Cysteamine Bitartrate (PROCYSBI) 300 MG PACK Use 1 each 3 (three) times daily Product selection permitted according to insurance preference. E11.9 Type 2 diabetes mellitus 01/05/20 01/04/21  [provider]  empagliflozin (JARDIANCE) 10 MG TABS tablet Take 10 mg by mouth daily with breakfast.    [provider]  ferrous sulfate 324 MG TBEC Take 324 mg by mouth daily. Patient not taking: Reported on 12/07/2020    [provider]  glucose blood (PRECISION QID TEST) test strip Use 1 each (1 strip total) 3 (three) times daily 01/05/20 01/04/21  [provider]  insulin glargine (LANTUS SOLOSTAR) 100 UNIT/ML Solostar Pen Inject 22 Units into the skin daily.    [provider]  insulin lispro (HUMALOG) 100 UNIT/ML KwikPen  Inject 6 Units into the skin 3 (three) times daily.    [provider]  lisinopril (ZESTRIL) 10 MG tablet Take 10 mg by mouth daily.    [provider]  metFORMIN (GLUCOPHAGE) 500 MG tablet Take 500 mg by mouth 2 (two) times daily with a meal.    [provider]  Multiple Vitamin (MULTI-VITAMIN) tablet Take 1 tablet by mouth daily. 01/05/20 01/04/21  [provider]  sertraline (ZOLOFT) 100 MG tablet Take 1 tablet (100 mg total) by mouth daily. 12/07/20 12/07/21  Court Joy, PA-C  traZODone (DESYREL) 100 MG tablet Take 1  tablet (100 mg total) by mouth at bedtime. 11/30/20 02/28/21  Court Joy, PA-C    Allergies    Patient has no known allergies.  Review of Systems   Review of Systems  Constitutional: Positive for diaphoresis. Negative for chills and fever.  HENT: Negative for sore throat.   Eyes: Negative for visual disturbance.  Respiratory: Negative for cough and shortness of breath.   Cardiovascular: Negative for chest pain.  Gastrointestinal: Positive for nausea and vomiting. Negative for abdominal pain and diarrhea.  Genitourinary: Negative for dysuria.  Musculoskeletal: Negative for neck pain.  Skin: Negative for rash.  Neurological: Negative for headaches.    Physical Exam Updated Vital Signs BP (!) 148/109   Pulse 80   Temp 98.3 F (36.8 C) (Oral)   Resp 14   Ht 6\' 2"  (1.88 m)   Wt 106.6 kg   SpO2 96%   BMI 30.17 kg/m   Physical Exam Vitals and nursing note reviewed.  Constitutional:      Appearance: Normal appearance. He is well-developed.  HENT:     Head: Normocephalic and atraumatic.  Eyes:     Conjunctiva/sclera: Conjunctivae normal.  Cardiovascular:     Rate and Rhythm: Normal rate and regular rhythm.     Heart sounds: No murmur heard.   Pulmonary:     Effort: Pulmonary effort is normal. No respiratory distress.     Breath sounds: Normal breath sounds.  Abdominal:     Palpations: Abdomen is soft.     Tenderness: There is no abdominal tenderness. There is no guarding or rebound.  Musculoskeletal:        General: Normal range of motion.     Cervical back: Neck supple.     Right lower leg: No edema.     Left lower leg: No edema.  Skin:    General: Skin is warm and dry.     Capillary Refill: Capillary refill takes less than 2 seconds.  Neurological:     General: No focal deficit present.     Mental Status: He is alert.     ED Results / Procedures / Treatments   Labs (all labs ordered are listed, but only abnormal results are displayed) Labs Reviewed   COMPREHENSIVE METABOLIC PANEL - Abnormal; Notable for the following components:      Result Value   Chloride 90 (*)    Glucose, Bld 227 (*)    AST 43 (*)    Alkaline Phosphatase 134 (*)    Total Bilirubin 1.3 (*)    Anion gap 18 (*)    All other components within normal limits  LIPASE, BLOOD - Abnormal; Notable for the following components:   Lipase <10 (*)    All other components within normal limits  CBC WITH DIFFERENTIAL/PLATELET  ETHANOL    EKG None  Radiology No results found.  Procedures Procedures   Medications  Ordered in ED Medications  sodium chloride 0.9 % bolus 1,000 mL (has no administration in time range)  ondansetron (ZOFRAN) injection 4 mg (has no administration in time range)    ED Course  I have reviewed the triage vital signs and the nursing notes.  Pertinent labs & imaging results that were available during my care of the patient were reviewed by me and considered in my medical decision making (see chart for details).  Clinical Course as of 12/16/20 1639  Wed Dec 16, 2020  1056 EKG not crossing into epic, normal sinus rhythm rate of 80 right axis low voltage precordial leads borderline repol abnormality.  No priors to compare with [MB]  1206 Reassessed patient, he is feeling better and tolerating p.o.  He is comfortable discharge.  Recommended close follow-up with his PCP. [MB]    Clinical Course User Index [MB] Terrilee Files, MD   MDM Rules/Calculators/A&P                         This patient complains of nausea vomiting diaphoresis; this involves an extensive number of treatment Options and is a complaint that carries with it a high risk of complications and Morbidity. The differential includes gastritis, peptic ulcer disease, reflux, diabetic reaction, ACS  I ordered, reviewed and interpreted labs, which included CBC with normal white count normal hemoglobin, chemistries fairly normal other than elevated glucose, LFTs equivocal and have  been elevated in the past, Mildly elevated but bicarb normal so likely reflecting more his low chloride, lipase normal, alcohol negative I ordered medication IV fluids and nausea medication with improvement in his symptoms Additional history obtained from EMS Previous records obtained and reviewed in epic, no recent ED admissions  After the interventions stated above, I reevaluated the patient and found patient symptoms to be resolved.  He is tolerating p.o. well here.  Recommended close follow-up with his PCP and following his blood sugars.  Return instructions discussed.   Final Clinical Impression(s) / ED Diagnoses Final diagnoses:  Non-intractable vomiting with nausea, unspecified vomiting type    Rx / DC Orders ED Discharge Orders         Ordered    ondansetron (ZOFRAN-ODT) 4 MG disintegrating tablet  Every 8 hours PRN        12/16/20 1207           Terrilee Files, MD 12/16/20 1642

## 2020-12-16 NOTE — ED Triage Notes (Signed)
Pt from [facility] for group therapy , per EMS pt felt nauseated and emesis during the ride . High BP at [facility].

## 2020-12-18 ENCOUNTER — Telehealth (HOSPITAL_COMMUNITY): Payer: Self-pay | Admitting: Behavioral Health

## 2020-12-18 ENCOUNTER — Ambulatory Visit (HOSPITAL_COMMUNITY): Payer: Medicaid Other | Admitting: Behavioral Health

## 2020-12-18 NOTE — Telephone Encounter (Signed)
Pt called to inform S. Spease he missed group today due to being at the hospital with his daughter.

## 2020-12-21 ENCOUNTER — Ambulatory Visit (HOSPITAL_COMMUNITY): Payer: Medicaid Other | Admitting: Behavioral Health

## 2020-12-23 ENCOUNTER — Ambulatory Visit (HOSPITAL_COMMUNITY): Payer: Medicaid Other | Admitting: Behavioral Health

## 2020-12-25 ENCOUNTER — Ambulatory Visit (HOSPITAL_COMMUNITY): Payer: Medicaid Other | Admitting: Behavioral Health

## 2020-12-28 ENCOUNTER — Ambulatory Visit (HOSPITAL_COMMUNITY): Payer: Medicaid Other | Admitting: Behavioral Health

## 2020-12-30 ENCOUNTER — Ambulatory Visit (HOSPITAL_COMMUNITY): Payer: Medicaid Other

## 2021-01-01 ENCOUNTER — Ambulatory Visit (HOSPITAL_COMMUNITY): Payer: Medicaid Other

## 2021-01-04 ENCOUNTER — Ambulatory Visit (HOSPITAL_COMMUNITY): Payer: Medicaid Other

## 2021-01-06 ENCOUNTER — Ambulatory Visit (HOSPITAL_COMMUNITY): Payer: Medicaid Other

## 2021-01-08 ENCOUNTER — Ambulatory Visit (HOSPITAL_COMMUNITY): Payer: Medicaid Other

## 2021-01-13 ENCOUNTER — Ambulatory Visit (HOSPITAL_COMMUNITY): Payer: Medicaid Other

## 2021-01-15 ENCOUNTER — Ambulatory Visit (HOSPITAL_COMMUNITY): Payer: Medicaid Other

## 2021-01-15 ENCOUNTER — Other Ambulatory Visit (HOSPITAL_COMMUNITY): Payer: Self-pay | Admitting: Medical

## 2021-02-09 ENCOUNTER — Other Ambulatory Visit (HOSPITAL_COMMUNITY): Payer: Self-pay | Admitting: Medical
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
## 2021-02-24 ENCOUNTER — Other Ambulatory Visit (HOSPITAL_COMMUNITY): Payer: Self-pay | Admitting: Medical

## 2021-03-07 ENCOUNTER — Other Ambulatory Visit (HOSPITAL_COMMUNITY): Payer: Self-pay | Admitting: Medical

## 2021-03-09 ENCOUNTER — Telehealth (HOSPITAL_COMMUNITY): Payer: Self-pay | Admitting: Licensed Clinical Social Worker

## 2021-03-11 ENCOUNTER — Other Ambulatory Visit (HOSPITAL_COMMUNITY): Payer: Self-pay | Admitting: Medical

## 2021-03-11 NOTE — Telephone Encounter (Signed)
IOP [facility] Pt stopped coming IOP Closed

## 2021-03-11 NOTE — Telephone Encounter (Signed)
SAIOP patient-program closed Pt b never came back after being seen

## 2021-03-15 NOTE — Telephone Encounter (Signed)
No longer SAIOP pt

## 2021-12-17 ENCOUNTER — Ambulatory Visit: Payer: Self-pay | Admitting: Internal Medicine

## 2021-12-17 NOTE — Progress Notes (Deleted)
Name: Joseph Cruz  MRN/ DOB: [id number], [birth year]   Age/ Sex: 46 y.o., male    PCP: Center, [facility]   Reason for Endocrinology Evaluation: Type 1 Diabetes Mellitus     Date of Initial Endocrinology Visit: 12/17/2021     PATIENT IDENTIFIER: Joseph Cruz is a 46 y.o. male with a past medical history of T1DM, Dyslipidemia and HTN. The patient presented for initial endocrinology clinic visit on 12/17/2021 for consultative assistance with his diabetes management.    HPI: Joseph Cruz was    Diagnosed with DM *** Prior Medications tried/Intolerance: *** Currently checking blood sugars *** x / day,  before breakfast and ***.  Hypoglycemia episodes : ***               Symptoms: ***                 Frequency: ***/  Hemoglobin A1c has ranged from *** in ***, peaking at 9.0% in ***. Patient required assistance for hypoglycemia:  Patient has required hospitalization within the last 1 year from hyper or hypoglycemia:   In terms of diet, the patient ***   HOME DIABETES REGIMEN: Metformin 500 mg  Jardiance 10 mg daily  Lantus  Humalog  Ozempic 0.5 mg weekly    Statin: yes ACE-I/ARB: yes Prior Diabetic Education: {Yes/No:11203}   METER DOWNLOAD SUMMARY: Date range evaluated: *** Fingerstick Blood Glucose Tests = *** Average Number Tests/Day = *** Overall Mean FS Glucose = *** Standard Deviation = ***  BG Ranges: Low = *** High = ***   Hypoglycemic Events/30 Days: BG < 50 = *** Episodes of symptomatic severe hypoglycemia = ***   DIABETIC COMPLICATIONS: Microvascular complications:  *** Denies: *** Last eye exam: Completed   Macrovascular complications:  *** Denies: CAD, PVD, CVA   PAST HISTORY: Past Medical History: No past medical history on file. Past Surgical History: No past surgical history on file.  Social History:  reports that he has been smoking cigars. He has never used smokeless tobacco. He reports current alcohol use. He reports that he does  not use drugs. Family History: No family history on file.   HOME MEDICATIONS: Allergies as of 12/17/2021   No Known Allergies      Medication List        Accurate as of Dec 17, 2021  6:54 AM. If you have any questions, ask your nurse or doctor.          Alcohol Wipes 70 % Pads Apply topically.   atorvastatin 40 MG tablet Commonly known as: LIPITOR Take 40 mg by mouth daily.   B-D UF III MINI PEN NEEDLES 31G X 5 MM Misc Generic drug: Insulin Pen Needle SMARTSIG:1 Each SUB-Q 4 Times Daily   carvedilol 6.25 MG tablet Commonly known as: COREG Take 6.25 mg by mouth 2 (two) times daily with a meal.   empagliflozin 10 MG Tabs tablet Commonly known as: JARDIANCE Take 10 mg by mouth daily with breakfast.   ferrous sulfate 324 MG Tbec Take 324 mg by mouth daily.   FreeStyle Libre 14 Day Reader Hardie Pulley Use 1 Device as directed   insulin lispro 100 UNIT/ML KwikPen Commonly known as: HUMALOG Inject 6 Units into the skin 3 (three) times daily.   Lantus SoloStar 100 UNIT/ML Solostar Pen Generic drug: insulin glargine Inject 22 Units into the skin daily.   lisinopril 10 MG tablet Commonly known as: ZESTRIL Take 10 mg by mouth daily.   metFORMIN 500 MG tablet Commonly  known as: GLUCOPHAGE Take 500 mg by mouth 2 (two) times daily with a meal.   ondansetron 4 MG disintegrating tablet Commonly known as: ZOFRAN-ODT Take 1 tablet (4 mg total) by mouth every 8 (eight) hours as needed for nausea or vomiting.   sertraline 100 MG tablet Commonly known as: Zoloft Take 1 tablet (100 mg total) by mouth daily.   traZODone 100 MG tablet Commonly known as: DESYREL Take 1 tablet (100 mg total) by mouth at bedtime.   vitamin C 1000 MG tablet Take 1,000 mg by mouth daily.         ALLERGIES: No Known Allergies   REVIEW OF SYSTEMS: A comprehensive ROS was conducted with the patient and is negative except as per HPI and below:  ROS    OBJECTIVE:   VITAL SIGNS: There  were no vitals taken for this visit.   PHYSICAL EXAM:  General: Pt appears well and is in NAD  Hydration: Well-hydrated with moist mucous membranes and good skin turgor  HEENT: Head: Unremarkable with good dentition. Oropharynx clear without exudate.  Eyes: External eye exam normal without stare, lid lag or exophthalmos.  EOM intact.  PERRL.  Neck: General: Supple without adenopathy or carotid bruits. Thyroid: Thyroid size normal.  No goiter or nodules appreciated. No thyroid bruit.  Lungs: Clear with good BS bilat with no rales, rhonchi, or wheezes  Heart: RRR with normal S1 and S2 and no gallops; no murmurs; no rub  Abdomen: Normoactive bowel sounds, soft, nontender, without masses or organomegaly palpable  Extremities:  Lower extremities - No pretibial edema. No lesions.  Skin: Normal texture and temperature to palpation. No rash noted. No Acanthosis nigricans/skin tags. No lipohypertrophy.  Neuro: MS is good with appropriate affect, pt is alert and Ox3    DM foot exam:    DATA REVIEWED:  No results found for: HGBA1C Lab Results  Component Value Date   CREATININE 1.14 12/16/2020   06/10/2022 BUN 20 Cr. 1.1 GFR 70 Tg 311 LDL 24   ASSESSMENT / PLAN / RECOMMENDATIONS:   1) Type 1 Diabetes Mellitus, ***controlled, With*** complications - Most recent A1c of *** %. Goal A1c < *** %.    Plan: GENERAL: ***  MEDICATIONS: ***  EDUCATION / INSTRUCTIONS: BG monitoring instructions: Patient is instructed to check his blood sugars *** times a day, ***. Call [place] [facility] if: BG persistently < 70 or > 300. I reviewed the Rule of 15 for the treatment of hypoglycemia in detail with the patient. Literature supplied.   2) Diabetic complications:  Eye: Does *** have known diabetic retinopathy.  Neuro/ Feet: Does *** have known diabetic peripheral neuropathy. Renal: Patient does *** have known baseline CKD. He is *** on an ACEI/ARB at present.Check urine  albumin/creatinine ratio yearly starting at time of diagnosis. If albuminuria is positive, treatment is geared toward better glucose, blood pressure control and use of ACE inhibitors or ARBs. Monitor electrolytes and creatinine once to twice yearly.   3) Lipids: Patient is *** on a statin.      Signed electronically by: Lyndle Herrlich, MD  [facility]  [facility] [address] [place] [place], [place] [address] Phone: [phone] FAX: [phone]   CC: Center, [facility] [address] [place] [address] Phone: [phone]  Fax: [phone]    Return to Endocrinology clinic as below: Future Appointments  Date Time Provider Department Center  12/17/2021 12:10 PM Iven Earnhart, Konrad Dolores, MD [facility] None

## 2022-02-12 DIAGNOSIS — E860 Dehydration: Secondary | ICD-10-CM | POA: Diagnosis not present

## 2022-07-04 ENCOUNTER — Ambulatory Visit: Payer: Medicaid Other | Admitting: Internal Medicine

## 2022-07-04 NOTE — Progress Notes (Deleted)
Name: Joseph Cruz  MRN/ DOB: [id number], 1976-03-28   Age/ Sex: 46 y.o., male    PCP: Kennon Holter, NP   Reason for Endocrinology Evaluation: Type 2 Diabetes Mellitus     Date of Initial Endocrinology Visit: 07/04/2022     PATIENT IDENTIFIER: Mr. Joseph Cruz is a 46 y.o. male with a past medical history of T2DM, Htn and Dyslipidemia. The patient presented for initial endocrinology clinic visit on 07/04/2022 for consultative assistance with his diabetes management.    HPI: Mr. Joseph Cruz was    Diagnosed with DM *** Prior Medications tried/Intolerance: *** Currently checking blood sugars *** x / day,  before breakfast and ***.  Hypoglycemia episodes : ***               Symptoms: ***                 Frequency: ***/  Hemoglobin A1c has ranged from *** in ***, peaking at 8.9% in 2022. Patient required assistance for hypoglycemia:  Patient has required hospitalization within the last 1 year from hyper or hypoglycemia:   In terms of diet, the patient ***   HOME DIABETES REGIMEN: Basal: ***  Bolus: ***   Statin: yes ACE-I/ARB: yes Prior Diabetic Education: {Yes/No:11203}   METER DOWNLOAD SUMMARY: Date range evaluated: *** Fingerstick Blood Glucose Tests = *** Average Number Tests/Day = *** Overall Mean FS Glucose = *** Standard Deviation = ***  BG Ranges: Low = *** High = ***   Hypoglycemic Events/30 Days: BG < 50 = *** Episodes of symptomatic severe hypoglycemia = ***   DIABETIC COMPLICATIONS: Microvascular complications:  *** Denies: *** Last eye exam: Completed   Macrovascular complications:  *** Denies: CAD, PVD, CVA   PAST HISTORY: Past Medical History: No past medical history on file. Past Surgical History: No past surgical history on file.  Social History:  reports that he has been smoking cigars. He has never used smokeless tobacco. He reports current alcohol use. He reports that he does not use drugs. Family History: No family history on  file.   HOME MEDICATIONS: Allergies as of 07/04/2022   No Known Allergies      Medication List        Accurate as of July 04, 2022  9:17 AM. If you have any questions, ask your nurse or doctor.          Alcohol Wipes 70 % Pads Apply topically.   atorvastatin 40 MG tablet Commonly known as: LIPITOR Take 40 mg by mouth daily.   B-D UF III MINI PEN NEEDLES 31G X 5 MM Misc Generic drug: Insulin Pen Needle SMARTSIG:1 Each SUB-Q 4 Times Daily   carvedilol 6.25 MG tablet Commonly known as: COREG Take 6.25 mg by mouth 2 (two) times daily with a meal.   empagliflozin 10 MG Tabs tablet Commonly known as: JARDIANCE Take 10 mg by mouth daily with breakfast.   ferrous sulfate 324 MG Tbec Take 324 mg by mouth daily.   FreeStyle Libre 14 Day Reader Joseph Cruz Use 1 Device as directed   insulin lispro 100 UNIT/ML KwikPen Commonly known as: HUMALOG Inject 6 Units into the skin 3 (three) times daily.   Lantus SoloStar 100 UNIT/ML Solostar Pen Generic drug: insulin glargine Inject 22 Units into the skin daily.   lisinopril 10 MG tablet Commonly known as: ZESTRIL Take 10 mg by mouth daily.   metFORMIN 500 MG tablet Commonly known as: GLUCOPHAGE Take 500 mg by mouth 2 (two) times daily with  a meal.   ondansetron 4 MG disintegrating tablet Commonly known as: ZOFRAN-ODT Take 1 tablet (4 mg total) by mouth every 8 (eight) hours as needed for nausea or vomiting.   sertraline 100 MG tablet Commonly known as: Zoloft Take 1 tablet (100 mg total) by mouth daily.   traZODone 100 MG tablet Commonly known as: DESYREL Take 1 tablet (100 mg total) by mouth at bedtime.   vitamin C 1000 MG tablet Take 1,000 mg by mouth daily.         ALLERGIES: No Known Allergies   REVIEW OF SYSTEMS: A comprehensive ROS was conducted with the patient and is negative except as per HPI and below:  ROS    OBJECTIVE:   VITAL SIGNS: There were no vitals taken for this visit.    PHYSICAL EXAM:  General: Pt appears well and is in NAD  Neck: General: Supple without adenopathy or carotid bruits. Thyroid: Thyroid size normal.  No goiter or nodules appreciated.   Lungs: Clear with good BS bilat with no rales, rhonchi, or wheezes  Heart: RRR   Abdomen:  soft, nontender  Extremities:  Lower extremities - No pretibial edema. No lesions.  Skin: Normal texture and temperature to palpation. No rash noted.  Neuro: MS is good with appropriate affect, pt is alert and Ox3    DM foot exam:    DATA REVIEWED:  No results found for: "HGBA1C" Lab Results  Component Value Date   CREATININE 1.14 12/16/2020   No results found for: "MICRALBCREAT"  No results found for: "CHOL", "HDL", "LDLCALC", "LDLDIRECT", "TRIG", "CHOLHDL"      ASSESSMENT / PLAN / RECOMMENDATIONS:   1) Type *** Diabetes Mellitus, ***controlled, With*** complications - Most recent A1c of *** %. Goal A1c < *** %.  ***  Plan: GENERAL: ***  MEDICATIONS: ***  EDUCATION / INSTRUCTIONS: BG monitoring instructions: Patient is instructed to check his blood sugars *** times a day, ***. Call [place] [facility] if: BG persistently < 70  I reviewed the Rule of 15 for the treatment of hypoglycemia in detail with the patient. Literature supplied.   2) Diabetic complications:  Eye: Does *** have known diabetic retinopathy.  Neuro/ Feet: Does *** have known diabetic peripheral neuropathy. Renal: Patient does *** have known baseline CKD. He is *** on an ACEI/ARB at present.  3) Lipids: Patient is *** on a statin.        Signed electronically by: Lyndle Herrlich, MD  [facility]  [facility] [address] [place] [place], [place] [address] Phone: [phone] FAX: [phone]   CC: Kennon Holter, NP [address] [place] [address] Phone: [phone]  Fax: [phone]    Return to Endocrinology clinic as below: Future Appointments   Date Time Provider Department Center  07/04/2022  1:20 PM Jamorion Gomillion, Konrad Dolores, MD [facility] None

## 2022-07-19 ENCOUNTER — Ambulatory Visit (INDEPENDENT_AMBULATORY_CARE_PROVIDER_SITE_OTHER): Payer: Medicaid Other | Admitting: Podiatry

## 2022-07-19 DIAGNOSIS — Z91199 Patient's noncompliance with other medical treatment and regimen due to unspecified reason: Secondary | ICD-10-CM

## 2022-07-19 NOTE — Progress Notes (Signed)
Pt was a no show for apt, no charge 

## 2023-12-09 ENCOUNTER — Emergency Department (HOSPITAL_COMMUNITY)
Admission: EM | Admit: 2023-12-09 | Discharge: 2023-12-09 | Disposition: A | Discharge status: Other Acute Inpatient Hospital | Payer: MEDICAID | Attending: Emergency Medicine | Admitting: Emergency Medicine

## 2023-12-09 ENCOUNTER — Emergency Department (HOSPITAL_COMMUNITY): Payer: MEDICAID

## 2023-12-09 ENCOUNTER — Other Ambulatory Visit (HOSPITAL_COMMUNITY)
Admission: EM | Admit: 2023-12-09 | Discharge: 2023-12-14 | Disposition: A | Discharge status: Home / Self Care | Payer: MEDICAID | Attending: Psychiatry | Admitting: Psychiatry

## 2023-12-09 ENCOUNTER — Ambulatory Visit (HOSPITAL_COMMUNITY)
Admission: EM | Admit: 2023-12-09 | Discharge: 2023-12-09 | Disposition: A | Discharge status: Other Acute Inpatient Hospital | Payer: MEDICAID | Attending: Psychiatry | Admitting: Psychiatry

## 2023-12-09 ENCOUNTER — Other Ambulatory Visit: Payer: Self-pay

## 2023-12-09 DIAGNOSIS — Z7984 Long term (current) use of oral hypoglycemic drugs: Secondary | ICD-10-CM | POA: Diagnosis not present

## 2023-12-09 DIAGNOSIS — E119 Type 2 diabetes mellitus without complications: Secondary | ICD-10-CM | POA: Insufficient documentation

## 2023-12-09 DIAGNOSIS — F1020 Alcohol dependence, uncomplicated: Secondary | ICD-10-CM | POA: Insufficient documentation

## 2023-12-09 DIAGNOSIS — Z5900 Homelessness unspecified: Secondary | ICD-10-CM | POA: Diagnosis not present

## 2023-12-09 DIAGNOSIS — M109 Gout, unspecified: Secondary | ICD-10-CM | POA: Diagnosis present

## 2023-12-09 DIAGNOSIS — F331 Major depressive disorder, recurrent, moderate: Secondary | ICD-10-CM | POA: Insufficient documentation

## 2023-12-09 DIAGNOSIS — Z794 Long term (current) use of insulin: Secondary | ICD-10-CM | POA: Insufficient documentation

## 2023-12-09 DIAGNOSIS — F410 Panic disorder [episodic paroxysmal anxiety] without agoraphobia: Secondary | ICD-10-CM

## 2023-12-09 DIAGNOSIS — L97411 Non-pressure chronic ulcer of right heel and midfoot limited to breakdown of skin: Secondary | ICD-10-CM | POA: Diagnosis present

## 2023-12-09 DIAGNOSIS — F332 Major depressive disorder, recurrent severe without psychotic features: Secondary | ICD-10-CM | POA: Diagnosis not present

## 2023-12-09 DIAGNOSIS — R45851 Suicidal ideations: Secondary | ICD-10-CM | POA: Insufficient documentation

## 2023-12-09 DIAGNOSIS — F339 Major depressive disorder, recurrent, unspecified: Secondary | ICD-10-CM | POA: Diagnosis present

## 2023-12-09 DIAGNOSIS — E08621 Diabetes mellitus due to underlying condition with foot ulcer: Secondary | ICD-10-CM | POA: Insufficient documentation

## 2023-12-09 DIAGNOSIS — F1090 Alcohol use, unspecified, uncomplicated: Secondary | ICD-10-CM

## 2023-12-09 DIAGNOSIS — S91301A Unspecified open wound, right foot, initial encounter: Secondary | ICD-10-CM

## 2023-12-09 LAB — URINALYSIS, ROUTINE W REFLEX MICROSCOPIC
Bacteria, UA: NONE SEEN
Bilirubin Urine: NEGATIVE
Glucose, UA: >=500 mg/dL — AB
Hgb urine dipstick: NEGATIVE
Ketones, ur: NEGATIVE mg/dL
Leukocytes,Ua: NEGATIVE
Nitrite: NEGATIVE
Protein, ur: NEGATIVE mg/dL
Specific Gravity, Urine: 1.022 (ref 1.005–1.030)
pH: 7 (ref 5.0–8.0)

## 2023-12-09 LAB — POCT URINE DRUG SCREEN - MANUAL ENTRY (I-SCREEN)
POC Amphetamine UR: NOT DETECTED
POC Buprenorphine (BUP): NOT DETECTED
POC Cocaine UR: NOT DETECTED
POC Marijuana UR: NOT DETECTED
POC Methadone UR: NOT DETECTED
POC Methamphetamine UR: NOT DETECTED
POC Morphine: NOT DETECTED
POC Oxazepam (BZO): NOT DETECTED
POC Oxycodone UR: NOT DETECTED
POC Secobarbital (BAR): NOT DETECTED

## 2023-12-09 LAB — CBC WITH DIFFERENTIAL/PLATELET
Abs Immature Granulocytes: 0.03 10*3/uL (ref 0.00–0.07)
Basophils Absolute: 0.1 10*3/uL (ref 0.0–0.1)
Basophils Relative: 1 %
Eosinophils Absolute: 0.1 10*3/uL (ref 0.0–0.5)
Eosinophils Relative: 1 %
HCT: 41.8 % (ref 39.0–52.0)
Hemoglobin: 14.1 g/dL (ref 13.0–17.0)
Immature Granulocytes: 0 %
Lymphocytes Relative: 19 %
Lymphs Abs: 1.6 10*3/uL (ref 0.7–4.0)
MCH: 28.7 pg (ref 26.0–34.0)
MCHC: 33.7 g/dL (ref 30.0–36.0)
MCV: 85.1 fL (ref 80.0–100.0)
Monocytes Absolute: 0.5 10*3/uL (ref 0.1–1.0)
Monocytes Relative: 6 %
Neutro Abs: 6.3 10*3/uL (ref 1.7–7.7)
Neutrophils Relative %: 73 %
Platelets: 345 10*3/uL (ref 150–400)
RBC: 4.91 MIL/uL (ref 4.22–5.81)
RDW: 12.6 % (ref 11.5–15.5)
WBC: 8.6 10*3/uL (ref 4.0–10.5)
nRBC: 0 % (ref 0.0–0.2)

## 2023-12-09 LAB — COMPREHENSIVE METABOLIC PANEL WITH GFR
ALT: 31 U/L (ref 0–44)
AST: 35 U/L (ref 15–41)
Albumin: 4.2 g/dL (ref 3.5–5.0)
Alkaline Phosphatase: 126 U/L (ref 38–126)
Anion gap: 12 (ref 5–15)
BUN: 14 mg/dL (ref 6–20)
CO2: 27 mmol/L (ref 22–32)
Calcium: 10.1 mg/dL (ref 8.9–10.3)
Chloride: 97 mmol/L — ABNORMAL LOW (ref 98–111)
Creatinine, Ser: 1.01 mg/dL (ref 0.61–1.24)
GFR, Estimated: >60 mL/min (ref >60)
Glucose, Bld: 193 mg/dL — ABNORMAL HIGH (ref 70–99)
Potassium: 3.7 mmol/L (ref 3.5–5.1)
Sodium: 136 mmol/L (ref 135–145)
Total Bilirubin: 0.7 mg/dL (ref 0.0–1.2)
Total Protein: 8.4 g/dL — ABNORMAL HIGH (ref 6.5–8.1)

## 2023-12-09 LAB — MAGNESIUM: Magnesium: 1.8 mg/dL (ref 1.7–2.4)

## 2023-12-09 LAB — SEDIMENTATION RATE: Sed Rate: 15 mm/h (ref 0–16)

## 2023-12-09 LAB — GLUCOSE, CAPILLARY: Glucose-Capillary: 195 mg/dL — ABNORMAL HIGH (ref 70–99)

## 2023-12-09 LAB — SARS CORONAVIRUS 2 BY RT PCR: SARS Coronavirus 2 by RT PCR: NEGATIVE

## 2023-12-09 LAB — TSH: TSH: 2.109 u[IU]/mL (ref 0.350–4.500)

## 2023-12-09 LAB — HEMOGLOBIN A1C
Hgb A1c MFr Bld: 8.3 % — ABNORMAL HIGH (ref 4.8–5.6)
Mean Plasma Glucose: 191.51 mg/dL

## 2023-12-09 LAB — LIPID PANEL
Cholesterol: 122 mg/dL (ref 0–200)
HDL: 49 mg/dL (ref >40)
LDL Cholesterol: 60 mg/dL (ref 0–99)
Total CHOL/HDL Ratio: 2.5 ratio
Triglycerides: 65 mg/dL (ref <150)
VLDL: 13 mg/dL (ref 0–40)

## 2023-12-09 LAB — C-REACTIVE PROTEIN: CRP: 0.8 mg/dL (ref <1.0)

## 2023-12-09 LAB — ETHANOL: Alcohol, Ethyl (B): <15 mg/dL (ref <15)

## 2023-12-09 MED ORDER — HALOPERIDOL 5 MG PO TABS: 5.0000 mg | ORAL_TABLET | Freq: Three times a day (TID) | ORAL | Status: DC | PRN

## 2023-12-09 MED ORDER — THIAMINE HCL 100 MG/ML IJ SOLN: 100.0000 mg | Freq: Once | INTRAMUSCULAR | Status: DC

## 2023-12-09 MED ORDER — DIPHENHYDRAMINE HCL 50 MG PO CAPS
50.0000 mg | ORAL_CAPSULE | Freq: Three times a day (TID) | ORAL | Status: DC | PRN
  Administered 2023-12-12: 50 mg via ORAL
  Filled 2023-12-09: qty 1

## 2023-12-09 MED ORDER — HALOPERIDOL LACTATE 5 MG/ML IJ SOLN: 5.0000 mg | Freq: Three times a day (TID) | INTRAMUSCULAR | Status: DC | PRN

## 2023-12-09 MED ORDER — LORAZEPAM 2 MG/ML IJ SOLN: 2.0000 mg | Freq: Three times a day (TID) | INTRAMUSCULAR | Status: DC | PRN

## 2023-12-09 MED ORDER — AMOXICILLIN-POT CLAVULANATE 875-125 MG PO TABS
1.0000 | ORAL_TABLET | Freq: Once | ORAL | Status: AC
  Administered 2023-12-09: 1 via ORAL
  Filled 2023-12-09: qty 1

## 2023-12-09 MED ORDER — METFORMIN HCL 500 MG PO TABS: 1000.0000 mg | ORAL_TABLET | Freq: Two times a day (BID) | ORAL | Status: DC

## 2023-12-09 MED ORDER — AMLODIPINE BESYLATE 10 MG PO TABS
10.0000 mg | ORAL_TABLET | Freq: Once | ORAL | Status: AC
  Administered 2023-12-09: 10 mg via ORAL
  Filled 2023-12-09: qty 1

## 2023-12-09 MED ORDER — EMPAGLIFLOZIN 10 MG PO TABS
10.0000 mg | ORAL_TABLET | Freq: Every day | ORAL | Status: DC
  Administered 2023-12-09: 10 mg via ORAL
  Filled 2023-12-09: qty 1

## 2023-12-09 MED ORDER — ALUM & MAG HYDROXIDE-SIMETH 200-200-20 MG/5ML PO SUSP: 30.0000 mL | ORAL | Status: DC | PRN

## 2023-12-09 MED ORDER — DIPHENHYDRAMINE HCL 50 MG/ML IJ SOLN: 50.0000 mg | Freq: Three times a day (TID) | INTRAMUSCULAR | Status: DC | PRN

## 2023-12-09 MED ORDER — HYDROXYZINE HCL 25 MG PO TABS
25.0000 mg | ORAL_TABLET | Freq: Four times a day (QID) | ORAL | Status: AC | PRN
  Administered 2023-12-11: 25 mg via ORAL
  Filled 2023-12-09: qty 1

## 2023-12-09 MED ORDER — ONDANSETRON 4 MG PO TBDP: 4.0000 mg | ORAL_TABLET | Freq: Four times a day (QID) | ORAL | Status: AC | PRN

## 2023-12-09 MED ORDER — INSULIN ASPART 100 UNIT/ML IJ SOLN: 0.0000 [IU] | Freq: Three times a day (TID) | INTRAMUSCULAR | Status: DC

## 2023-12-09 MED ORDER — MAGNESIUM HYDROXIDE 400 MG/5ML PO SUSP: 30.0000 mL | Freq: Every day | ORAL | Status: DC | PRN

## 2023-12-09 MED ORDER — TRAZODONE HCL 50 MG PO TABS
50.0000 mg | ORAL_TABLET | Freq: Every evening | ORAL | Status: DC | PRN
  Administered 2023-12-10 – 2023-12-13 (×5): 50 mg via ORAL
  Filled 2023-12-09 (×5): qty 1

## 2023-12-09 MED ORDER — DULOXETINE HCL 30 MG PO CPEP
30.0000 mg | ORAL_CAPSULE | Freq: Every day | ORAL | Status: DC
  Administered 2023-12-09: 30 mg via ORAL
  Filled 2023-12-09: qty 1

## 2023-12-09 MED ORDER — LOPERAMIDE HCL 2 MG PO CAPS: 2.0000 mg | ORAL_CAPSULE | ORAL | Status: AC | PRN

## 2023-12-09 MED ORDER — HALOPERIDOL LACTATE 5 MG/ML IJ SOLN: 10.0000 mg | Freq: Three times a day (TID) | INTRAMUSCULAR | Status: DC | PRN

## 2023-12-09 MED ORDER — MAGNESIUM HYDROXIDE 400 MG/5ML PO SUSP: 30.0000 mL | Freq: Every day | ORAL | 0 refills | Status: DC | PRN

## 2023-12-09 MED ORDER — LORAZEPAM 1 MG PO TABS: 1.0000 mg | ORAL_TABLET | Freq: Four times a day (QID) | ORAL | Status: AC | PRN

## 2023-12-09 MED ORDER — TRAZODONE HCL 100 MG PO TABS: 100.0000 mg | ORAL_TABLET | Freq: Every evening | ORAL | Status: DC | PRN

## 2023-12-09 MED ORDER — BUSPIRONE HCL 10 MG PO TABS: 10.0000 mg | ORAL_TABLET | Freq: Three times a day (TID) | ORAL | Status: DC

## 2023-12-09 MED ORDER — INSULIN ASPART 100 UNIT/ML IJ SOLN: 0.0000 [IU] | Freq: Three times a day (TID) | INTRAMUSCULAR | 11 refills | Status: AC

## 2023-12-09 MED ORDER — METFORMIN HCL 1000 MG PO TABS: 1000.0000 mg | ORAL_TABLET | Freq: Two times a day (BID) | ORAL | 0 refills | Status: AC

## 2023-12-09 MED ORDER — GABAPENTIN 300 MG PO CAPS
600.0000 mg | ORAL_CAPSULE | Freq: Three times a day (TID) | ORAL | Status: DC
Start: 2023-12-09 — End: 2023-12-09

## 2023-12-09 MED ORDER — ACETAMINOPHEN 325 MG PO TABS
650.0000 mg | ORAL_TABLET | Freq: Four times a day (QID) | ORAL | Status: DC | PRN
  Administered 2023-12-11 – 2023-12-13 (×3): 650 mg via ORAL
  Filled 2023-12-09 (×3): qty 2

## 2023-12-09 MED ORDER — THIAMINE MONONITRATE 100 MG PO TABS
100.0000 mg | ORAL_TABLET | Freq: Every day | ORAL | Status: DC
  Administered 2023-12-10 – 2023-12-14 (×5): 100 mg via ORAL
  Filled 2023-12-09 (×5): qty 1

## 2023-12-09 MED ORDER — AMOXICILLIN-POT CLAVULANATE 875-125 MG PO TABS: 1.0000 | ORAL_TABLET | Freq: Two times a day (BID) | ORAL | 0 refills | Status: DC

## 2023-12-09 MED ORDER — ADULT MULTIVITAMIN W/MINERALS CH
1.0000 | ORAL_TABLET | Freq: Every day | ORAL | Status: DC
  Administered 2023-12-10 – 2023-12-14 (×5): 1 via ORAL
  Filled 2023-12-09 (×5): qty 1

## 2023-12-09 MED ORDER — ACETAMINOPHEN 325 MG PO TABS: 650.0000 mg | ORAL_TABLET | Freq: Four times a day (QID) | ORAL | Status: DC | PRN

## 2023-12-09 MED ORDER — DIPHENHYDRAMINE HCL 50 MG PO CAPS: 50.0000 mg | ORAL_CAPSULE | Freq: Three times a day (TID) | ORAL | Status: DC | PRN

## 2023-12-09 NOTE — ED Notes (Signed)
 Patient admitted to [facility] voluntarily with the c/o suicidal thoughts and unable to sleep. On arrival, pt A/Ox4, MAE, and seems like someone who had lost some weight. 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 Patient stated "I am diabetic and was not taking good care of myself". Oriented to the unit and unit rules, MHT offered patient something to eat and drink. Denies SI/HI & AVH at the moment, patient is passive. Oriented to room, environment secured  and made comfortable in bed. Will monitor for safety

## 2023-12-09 NOTE — Progress Notes (Signed)
   12/09/23 0803  [facility] Triage Screening (Walk-ins at [facility] only)  How Did You Hear About Us ? Family/Friend  What Is the Reason for Your Visit/Call Today? Joseph Cruz is a 48 year old male presenting to [facility] accompanied by a woman. Pt reports that he is having suicidal thoughts and is unable to sleep. Pt states he has a plan to jump into traffic to end his life at any point and time. Pt reports he has ongoing SI thoughts and would like to be admitted for inpatient. Pt denies substance use, Hi and AVH.  How Long Has This Been Causing You Problems? 1 wk - 1 month  Have You Recently Had Any Thoughts About Hurting Yourself? Yes  How long ago did you have thoughts about hurting yourself? today  Are You Planning to Commit Suicide/Harm Yourself At This time? Yes  Have you Recently Had Thoughts About Hurting Someone Marigene Shoulder? No  Are You Planning To Harm Someone At This Time? No  Physical Abuse Denies  Verbal Abuse Denies  Sexual Abuse Denies  Exploitation of patient/patient's resources Denies  Self-Neglect Denies  Possible abuse reported to: Other (Comment)  Are you currently experiencing any auditory, visual or other hallucinations? No  Have You Used Any Alcohol  or Drugs in the Past 24 Hours? No  Do you have any current medical co-morbidities that require immediate attention? No  Clinician description of patient physical appearance/behavior: calm, cooperative  What Do You Feel Would Help You the Most Today? Medication(s)  If access to [facility] was not available, would you have sought care in the Emergency Department? No  Determination of Need Urgent (48 hours)  Options For Referral Medication Management;Outpatient Therapy  Determination of Need filed? Yes

## 2023-12-09 NOTE — ED Notes (Signed)
 Patient showering. Environment secured.

## 2023-12-09 NOTE — ED Notes (Signed)
 Patient transferred to [facility] via safe transport for medical clearance.

## 2023-12-09 NOTE — ED Provider Notes (Signed)
 [place] [facility]ERGENC[facility] DEPARTMENT AT [facility] Provider Note  CSN: [id number] Arrival date & time: 12/09/23 1509  Chief Complaint(s) foot infection/from [facility]  HPI Melvine Phaneuf is a 48 y.o. male history of alcohol  use disorder, depression presenting from behavioral urgent care for wound check.  Patient reports he has had a wound in his right foot for about a month.  He cannot feel his foot.  He has seen podiatry previously.  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  No fevers or chills.  No chest pain or shortness of breath.  No nausea or vomiting.   Past Medical History No past medical history on file. Patient Active Problem List   Diagnosis Date Noted   Moderate episode of recurrent major depressive disorder (HCC) 12/09/2023   Suicidal ideation 12/09/2023   Alcohol  use disorder, severe, dependence (HCC) 12/09/2023   Panic disorder without agoraphobia 12/09/2023   Home Medication(s) Prior to Admission medications   Medication Sig Start Date End Date Taking? Authorizing Provider  amoxicillin-clavulanate (AUGMENTIN) 875-125 MG tablet Take 1 tablet by mouth 2 (two) times daily for 10 days. 12/09/23 12/19/23 Yes Mordecai Applebaum, MD  amLODipine (NORVASC) 10 MG tablet Take 10 mg by mouth daily. 09/26/23 09/25/24  [provider]  atorvastatin (LIPITOR) 40 MG tablet Take 40 mg by mouth daily.    [provider]  busPIRone (BUSPAR) 10 MG tablet Take 10 mg by mouth 3 (three) times daily. 04/05/23   [provider]  carvedilol (COREG) 6.25 MG tablet Take 6.25 mg by mouth 2 (two) times daily with a meal.    [provider]  DULoxetine (CYMBALTA) 30 MG capsule Take 30 mg by mouth daily. 04/05/23   [provider]  empagliflozin (JARDIANCE) 10 MG TABS tablet Take 10 mg by mouth daily with breakfast.    [provider]  gabapentin (NEURONTIN) 600 MG tablet Take 600 mg by mouth 3 (three) times daily.    [provider]  insulin  aspart (NOVOLOG) 100 UNIT/ML injection Inject 0-9 Units into the skin 3 (three) times daily with meals. 12/09/23   Nicklas Barns, MD  insulin glargine (LANTUS SOLOSTAR) 100 UNIT/ML Solostar Pen Inject 22 Units into the skin daily.    [provider]  linagliptin (TRADJENTA) 5 MG TABS tablet Take 5 mg by mouth daily. 09/26/23 09/25/24  [provider]  lisinopril (ZESTRIL) 10 MG tablet Take 10 mg by mouth daily.    [provider]  metFORMIN (GLUCOPHAGE) 1000 MG tablet Take 1 tablet (1,000 mg total) by mouth 2 (two) times daily with a meal. 12/09/23   Nicklas Barns, MD  naloxone [facility]) nasal spray 4 mg/0.1 mL Place 1 spray into the nose once. 12/20/22 12/20/23  [provider]  Pancrelipase, Lip-Prot-Amyl, 3000-9500 units CPEP Take 3 capsules by mouth 3 (three) times daily. 09/26/23 09/25/24  [provider]  Past Surgical History No past surgical history on file. Family History No family history on file.  Social History Social History   Tobacco Use   Smoking status: Light Smoker    Types: Cigars   Smokeless tobacco: Never  Substance Use Topics   Alcohol  use: Yes   Drug use: Never   Allergies Patient has no known allergies.  Review of Systems Review of Systems  All other systems reviewed and are negative.   Physical Exam Vital Signs  I have reviewed the triage vital signs BP (!) 138/99   Pulse 87   Temp 98 F (36.7 C)   Resp 16   Ht 6\' 2"  (1.88 m)   Wt 106 kg   SpO2 100%   BMI 30.00 kg/m  Physical Exam Vitals and nursing note reviewed.  Constitutional:      General: He is not in acute distress.    Appearance: Normal appearance.  HENT:     Head: Normocephalic and atraumatic.     Mouth/Throat:     Mouth: Mucous membranes are moist.  Eyes:     Conjunctiva/sclera: Conjunctivae normal.   Cardiovascular:     Rate and Rhythm: Normal rate.  Pulmonary:     Effort: Pulmonary effort is normal. No respiratory distress.  Abdominal:     General: Abdomen is flat.  Musculoskeletal:     Comments: Chronic changes of the right foot secondary to amputation of digits.  Approximately 5 x 3 cm oval-shaped full-thickness ulcer, no exposed bone, no purulent drainage, some slight foul smell.  Foot warm and well-perfused, 2+ DP pulse.  Skin:    General: Skin is warm and dry.     Capillary Refill: Capillary refill takes less than 2 seconds.  Neurological:     General: No focal deficit present.     Mental Status: He is alert. Mental status is at baseline.  Psychiatric:        Mood and Affect: Mood normal.        Behavior: Behavior normal.     ED Results and Treatments Labs (all labs ordered are listed, but only abnormal results are displayed) Labs Reviewed - No data to display                                                                                                                        Radiology DG Foot Complete Right Result Date: 12/09/2023 CLINICAL DATA:  Right foot blister EXAM: RIGHT FOOT COMPLETE - 3+ VIEW COMPARISON:  05/07/2018 FINDINGS: Frontal, oblique, lateral views of the right foot are obtained. Postsurgical changes from prior transmetatarsal amputation. There are no acute displaced fractures. No evidence of bony destruction or periosteal reaction to suggest osteomyelitis, though assessment at the amputation site is somewhat limited by lack of comparison postoperative imaging. There is diffuse soft tissue swelling throughout the midfoot. Ulceration along the plantar margin of the amputation site adjacent to the second through fourth metatarsals. No radiopaque foreign bodies. IMPRESSION: 1. Prior transmetatarsal amputation of  the midfoot. 2. Marked soft tissue swelling, with ulceration along the amputation site at the second through fourth metatarsals. No bony destruction or  periosteal reaction to suggest osteomyelitis. Electronically Signed   By: Bobbye Burrow M.D.   On: 12/09/2023 18:21    Pertinent labs & imaging results that were available during my care of the patient were reviewed by me and considered in my medical decision making (see MDM for details).  Medications Ordered in ED Medications  amoxicillin-clavulanate (AUGMENTIN) 875-125 MG per tablet 1 tablet (has no administration in time range)                                                                                                                                     Procedures Procedures  (including critical care time)  Medical Decision Making / ED Course   MDM:  48 year old presenting to the emergency department with foot wound.  On exam, patient has a large ulceration to the sole of his foot.  He cannot feel his foot, seems most likely pressure related.  Not obviously acutely infected.  Labs were drawn earlier today in behavioral health including ESR and CRP which were normal.  Will obtain x-ray.  If this is negative, anticipate sending patient back to behavioral health urgent care.  Will probably cover with antibiotics the patient will ultimately need to follow-up with podiatry and try to minimize weightbearing on that foot.  Wound does have high risk of progression to acute infection  Clinical Course as of 12/09/23 1853  Sat Dec 09, 2023  1850 X-ray negative for signs of osteomyelitis.  Labs reviewed from earlier including normal ESR and CRP.  Low concern for occult deep space infection or abscess.  Suspect this is pressure related from chronic neuropathy and ambulation, will prescribe antibiotics, but ultimately patient needs to follow-up with podiatry to prevent worsening.  Do not think he needs to be admitted to the hospital right now.  Can be transferred back to behavioral health urgent care.  Have prescribed Augmentin 875 twice daily for 10 days.  Patient previously followed with Dr.  Rosemarie Conquest with podiatry. [WS]    Clinical Course User Index [WS] Mordecai Applebaum, MD     Additional history obtained:  -External records from outside source obtained and reviewed including: Chart review including previous notes, labs, imaging, consultation notes including [facility] records    Lab Tests: -I ordered, reviewed, and interpreted labs.   The pertinent results include:   Normal ESR and CRP  Imaging Studies ordered: I ordered imaging studies including XR foot  On my interpretation imaging demonstrates ulceration, without signs of osteomyelitis  I independently visualized and interpreted imaging. I agree with the radiologist interpretation   Medicines ordered and prescription drug management: Meds ordered this encounter  Medications   amoxicillin-clavulanate (AUGMENTIN) 875-125 MG per tablet 1 tablet   amoxicillin-clavulanate (AUGMENTIN) 875-125 MG tablet    Sig: Take 1 tablet  by mouth 2 (two) times daily for 10 days.    Dispense:  20 tablet    Refill:  0    -I have reviewed the patients home medicines and have made adjustments as needed  Social Determinants of Health:  Diagnosis or treatment significantly limited by social determinants of health: alcohol  use   Reevaluation: After the interventions noted above, I reevaluated the patient and found that their symptoms have improved  Co morbidities that complicate the patient evaluation No past medical history on file.    Dispostion: Disposition decision including need for hospitalization was considered, and patient discharged from emergency department.    Final Clinical Impression(s) / ED Diagnoses Final diagnoses:  Diabetic ulcer of right midfoot associated with diabetes mellitus due to underlying condition, limited to breakdown of skin [facility])     This chart was dictated using voice recognition software.  Despite best efforts to proofread,  errors can occur which can change the documentation meaning.     Mordecai Applebaum, MD 12/09/23 [phone]

## 2023-12-09 NOTE — ED Notes (Addendum)
 ED Charge nurse [facility] coordinating with RN and safe transport team. Transport aware patient going to [facility].

## 2023-12-09 NOTE — ED Notes (Signed)
 Safe transport called for transport to [facility]

## 2023-12-09 NOTE — Discharge Instructions (Signed)
 We evaluated you for your foot ulcer.  Your testing earlier today was reassuring.  Your x-ray does not show any sign of bone involvement.  Your foot ulcer is probably due to the pressure you placed on it when walking.  It is extremely important to follow-up with a podiatrist.  Please check your foot wound every day as even with antibiotic treatment, the wound may still worsen.  If you develop any worsening swelling, fevers or chills, drainage from the wound, color change to your foot, please return to the emergency department.

## 2023-12-09 NOTE — ED Notes (Addendum)
 Pt walked in to [facility] endorsing SI w/plan to jump into traffic due to being homeless. Pt states, "I need a place to stay until Thurs. Then I can return to the rehab I was at. I was kicked out because of a girl. We started liking each other and it's not allowed in there. I'm tired of things not working out for me". Pt completed acquired labs and skin assessment. On skin assessment, writer observed open wound to ball of L foot. Pt have L foot phalanges amputated. Pt reports the wound was a blister that got big, ruptured approx. 3 weeks ago and have gotten worse. Area is approx. 2" x 1" in diameter, non-drainage but surrounding skin is emaciated. Pt reports the wound is getting worse now. Wound has foul odor. Pt diabetic and have BS monitor in pocket and DexCom7 device attached to R outer arm. Provider made aware of open wound. Pt reports carb counting to manage his BS. Latest reading on device 217. Pt reports using Lantus at night. Pt took shower in assessment area. Pt ate sandwich and had cup of ice water for lunch. Pt denies SI at present but unable to contract for safety if discharged from facility. Awaiting orders from provider on next steps due to medical condition. Will monitor for safety.

## 2023-12-09 NOTE — ED Triage Notes (Signed)
 Pt reports he came from [facility] with right foot blister. Pt noticed it a month ago and has completed abx. Blood sugar 200s in triage. Pt reports being homeless and not sure what to do and feeling helpless. States they did labs today.

## 2023-12-09 NOTE — ED Notes (Signed)
 Report called to Rachell RN at [facility] at this time.

## 2023-12-09 NOTE — ED Provider Notes (Signed)
 [facility] Continuous Assessment Admission H&P  Date: 12/09/23 Patient Name: Joseph Cruz MRN: [id number] Chief Complaint: SI with plan to walk into traffic  Diagnoses:  Final diagnoses:  Moderate episode of recurrent major depressive disorder (HCC)  Suicidal ideation  Alcohol  use disorder, severe, dependence (HCC)  Panic disorder without agoraphobia    HPI: Joseph Cruz is a 48 year old male presenting to [facility] accompanied by his mother. Pt reports that he is having suicidal thoughts and is unable to sleep. Pt states he has a plan to jump into traffic to end his life at any point and time. Pt reports he has ongoing SI thoughts and would like to be admitted for inpatient. Pt denies substance use, Hi and AVH. Current anxiety 8/10, dysthymia 10/10.  Joseph Cruz has been at [facility]) for 5 months receiving alcohol  abuse treatment. He responded well to the structure but broke a facility rule (contacting someone by phone, fraternizing) and was forced to leave the program for a minimum of 7 days. This occurred on Thursday 4/24. Joseph Cruz provided [employer] for him to travel to parents' home in [place]. He's been sleeping outside behind their home since then. Yesterday (4/25) he relapsed after nearly 6 months clean. He consumed a [employer] and (3) [employer]. Father gave blunt assessment which exacerbated Joseph Cruz frustration, hopelessness, worthlessness to the extent he thought about walking into traffic. Patient does not want to die but intrusive thoughts has him concerned so his mother brought him to [facility] for admission/monitoring.   Duloxetine 30mg  for the past month is well-tolerated and possibly somewhat beneficial. Buspirone 10mg  TID also well-tolerated but unclear utility for anxiety. Gabapentin helpful for neuropathic pain and post-amputation pain.  Patient reports history of severe delirium tremens with last episode occurring in 2024. He denies significant illicit  drug use (occasional MJ). Previous EtOH IOP at [facility]. Prior to that a similar program in [place].   Prior to amputation surgery in December 2024, patient ate entire chocolate cake which likely accounts for 500+ blood sugar. Otherwise he's been managing diabetes with Lantus/Humalog in addition to metformin/Jardiance. Patient taking less insulin past few days due to concern for hypoglycemia if he cannot access food. Patient disturbed by thought of dying in a coma. DexCom7 for CGM. Amlodipine for HTN but unsure of last use of carvedilol.   Total Time spent with patient: 1 hour  Musculoskeletal  Strength & Muscle Tone: decreased Gait & Station:  slow transition and gait Patient leans:  compensation for right foot phalange amputation  Psychiatric Specialty Exam  Presentation General Appearance:  Appropriate for Environment; Casual  Eye Contact: Fleeting  Speech: Clear and Coherent; Slow  Speech Volume: Decreased  Handedness:No data recorded  Mood and Affect  Mood: Anxious; Depressed; Hopeless  Affect: Congruent; Depressed   Thought Process  Thought Processes: Coherent; Goal Directed  Descriptions of Associations:Intact  Orientation:Full (Time, Place and Person)  Thought Content:No data recorded Diagnosis of Schizophrenia or Schizoaffective disorder in past: No data recorded  Hallucinations:Hallucinations: None  Ideas of Reference:None  Suicidal Thoughts:Suicidal Thoughts: Yes, Active SI Active Intent and/or Plan: With Plan  Homicidal Thoughts:Homicidal Thoughts: No   Sensorium  Memory: Remote Good; Immediate Good; Recent Good  Judgment: Impaired  Insight: Fair   Chartered certified accountant: Fair  Attention Span: Fair  Recall: [employer] of Knowledge: Fair  Language: Fair   Psychomotor Activity  Psychomotor Activity: Psychomotor Activity: Psychomotor Retardation   Assets  Assets: Communication Skills; Desire for  Improvement   Sleep  Sleep: Sleep notable for initial/middle insomnia particularly after discontinuation of gabapentin (left at treatment facility on Thursday)   Nutritional Assessment (For OBS and [facility] admissions only) Has the patient had a weight loss or gain of 10 pounds or more in the last 3 months?: Yes Has the patient had a decrease in food intake/or appetite?: Yes Does the patient have dental problems?: No Does the patient have eating habits or behaviors that may be indicators of an eating disorder including binging or inducing vomiting?: No Has the patient been eating poorly because of a decreased appetite?: 0 (Insulin-dependent so regulating intake to avoid hypglycemia)    Physical Exam HENT:     Head: Normocephalic.  Feet:     Right foot:     Skin integrity: Ulcer and skin breakdown present.     Comments: Amputation of all phalanges of right foot. Well-healed. Plantar surface of right foot has ~7cm diameter ovoid wound under metatarsals. Homogenous granulation tissue likely exposed after blister lysed several days ago. Skin:    Comments: Wound on plantar surface of right foot. Homogenous granulation tissue of ~7cm diameter bound by extremely well-demarcated skin margin. Malodorous.  Neurological:     Mental Status: He is alert.    ROS  Blood pressure 136/79, pulse 99, temperature 98.9 F (37.2 C), temperature source Oral, resp. rate 19, SpO2 99%. There is no height or weight on file to calculate BMI.  Past Psychiatric History: GAD possibly with panic disorder.  AUD h/o DT.   Is the patient at risk to self? Yes  Has the patient been a risk to self in the past 6 months? Yes .    Has the patient been a risk to self within the distant past? No   Is the patient a risk to others? No   Has the patient been a risk to others in the past 6 months? No   Has the patient been a risk to others within the distant past? No   Past Medical History: DM2 now insulin dependent. Trial  of semaglutide for DM2 resulted in extensive weight loss 2 years ago. GLP1 discontinued but patient continued to lose weight possibly secondary to pancreatitis and other chronic health conditions. Diabetic neuropathy Diabetic kidney disease Pancreatitis (EtOH) pancreatic atrophy w/ calcifications GERD Cardiomyopathy HTN 08/10/23 surgical amputation of right foot phalanges Splenomegaly          H/o MRSA 2024                                               Family History: Parents and sister live in [place].  Social History: Born raised in [place] then family moved to [place]. Enjoys fishing.   Last Labs:  No visits with results within 6 Month(s) from this visit.  Latest known visit with results is:  Admission on 12/16/2020, Discharged on 12/16/2020  Component Date Value Ref Range Status   Sodium 12/16/2020 135  135 - 145 mmol/L Final   Potassium 12/16/2020 3.8  3.5 - 5.1 mmol/L Final   Chloride 12/16/2020 90 (L)  98 - 111 mmol/L Final   CO2 12/16/2020 27  22 - 32 mmol/L Final   Glucose, Bld 12/16/2020 227 (H)  70 - 99 mg/dL Final   Glucose reference range applies only to samples taken after fasting for at least 8 hours.   BUN  12/16/2020 13  6 - 20 mg/dL Final   Creatinine, Ser 12/16/2020 1.14  0.61 - 1.24 mg/dL Final   Calcium 40/98/1191 9.9  8.9 - 10.3 mg/dL Final   Total Protein 47/82/9562 8.0  6.5 - 8.1 g/dL Final   Albumin 13/03/6577 4.4  3.5 - 5.0 g/dL Final   AST 46/96/2952 43 (H)  15 - 41 U/L Final   ALT 12/16/2020 36  0 - 44 U/L Final   Alkaline Phosphatase 12/16/2020 134 (H)  38 - 126 U/L Final   Total Bilirubin 12/16/2020 1.3 (H)  0.3 - 1.2 mg/dL Final   GFR, Estimated 12/16/2020 >60  >60 mL/min Final   Comment: (NOTE) Calculated using the CKD-EPI Creatinine Equation (2021)    Anion gap 12/16/2020 18 (H)  5 - 15 Final   Performed at [facility], [address], [place], [place] [address]   WBC 12/16/2020 9.0  4.0 - 10.5 K/uL Final   RBC  12/16/2020 4.73  4.22 - 5.81 MIL/uL Final   Hemoglobin 12/16/2020 15.8  13.0 - 17.0 g/dL Final   HCT 44/08/270 45.0  39.0 - 52.0 % Final   MCV 12/16/2020 95.1  80.0 - 100.0 fL Final   MCH 12/16/2020 33.4  26.0 - 34.0 pg Final   MCHC 12/16/2020 35.1  30.0 - 36.0 g/dL Final   RDW 53/66/4403 13.5  11.5 - 15.5 % Final   Platelets 12/16/2020 305  150 - 400 K/uL Final   nRBC 12/16/2020 0.0  0.0 - 0.2 % Final   Neutrophils Relative % 12/16/2020 78  % Final   Neutro Abs 12/16/2020 7.0  1.7 - 7.7 K/uL Final   Lymphocytes Relative 12/16/2020 15  % Final   Lymphs Abs 12/16/2020 1.3  0.7 - 4.0 K/uL Final   Monocytes Relative 12/16/2020 5  % Final   Monocytes Absolute 12/16/2020 0.5  0.1 - 1.0 K/uL Final   Eosinophils Relative 12/16/2020 1  % Final   Eosinophils Absolute 12/16/2020 0.1  0.0 - 0.5 K/uL Final   Basophils Relative 12/16/2020 1  % Final   Basophils Absolute 12/16/2020 0.1  0.0 - 0.1 K/uL Final   Immature Granulocytes 12/16/2020 0  % Final   Abs Immature Granulocytes 12/16/2020 0.02  0.00 - 0.07 K/uL Final   Performed at [facility], [address] [place], [place] [address]   Lipase 12/16/2020 <10 (L)  11 - 51 U/L Final   Performed at [facility], [address], [place], [place] [address]   Alcohol , Ethyl (B) 12/16/2020 <10  <10 mg/dL Final   Comment: (NOTE) Lowest detectable limit for serum alcohol  is 10 mg/dL.  For medical purposes only. Performed at [facility], [address] [place], [place] [address]     Allergies: Patient has no known allergies.  Medications:  Facility Ordered Medications  Medication   acetaminophen (TYLENOL) tablet 650 mg   alum & mag hydroxide-simeth (MAALOX/MYLANTA) 200-200-20 MG/5ML suspension 30 mL   magnesium hydroxide (MILK OF MAGNESIA) suspension 30 mL   haloperidol (HALDOL) tablet 5 mg   And   diphenhydrAMINE  (BENADRYL ) capsule 50 mg   traZODone  (DESYREL ) tablet 100 mg    DULoxetine (CYMBALTA) DR capsule 30 mg   gabapentin (NEURONTIN) tablet 600 mg   busPIRone (BUSPAR) tablet 10 mg   amLODipine (NORVASC) tablet 10 mg   empagliflozin (JARDIANCE) tablet 10 mg   metFORMIN (GLUCOPHAGE) tablet 1,000 mg   insulin aspart (novoLOG) injection 0-9 Units   PTA Medications  Medication  Sig   amLODipine (NORVASC) 10 MG tablet Take 10 mg by mouth daily.   busPIRone (BUSPAR) 10 MG tablet Take 10 mg by mouth 3 (three) times daily.   celecoxib (CELEBREX) 200 MG capsule Take 200 mg by mouth 2 (two) times daily.   DULoxetine (CYMBALTA) 30 MG capsule Take 30 mg by mouth daily.   naloxone (NARCAN) nasal spray 4 mg/0.1 mL Place 1 spray into the nose once.   Pancrelipase, Lip-Prot-Amyl, 3000-9500 units CPEP Take 3 capsules by mouth 3 (three) times daily.   pregabalin (LYRICA) 100 MG capsule Take 100 mg by mouth 3 (three) times daily.   HUMALOG 100 UNIT/ML injection Inject 8 Units into the skin 3 (three) times daily with meals.   insulin lispro (HUMALOG) 100 UNIT/ML KwikPen Inject 0-8 Units into the skin 3 (three) times daily. Sliding scale   carvedilol (COREG) 6.25 MG tablet Take 6.25 mg by mouth 2 (two) times daily with a meal.   lisinopril (ZESTRIL) 10 MG tablet Take 10 mg by mouth daily.   atorvastatin (LIPITOR) 40 MG tablet Take 40 mg by mouth daily.   metFORMIN (GLUCOPHAGE) 500 MG tablet Take 500 mg by mouth 2 (two) times daily with a meal.   empagliflozin (JARDIANCE) 10 MG TABS tablet Take 10 mg by mouth daily with breakfast.   insulin glargine (LANTUS SOLOSTAR) 100 UNIT/ML Solostar Pen Inject 22 Units into the skin daily.   traZODone  (DESYREL ) 100 MG tablet Take 1 tablet (100 mg total) by mouth at bedtime.   sertraline  (ZOLOFT ) 100 MG tablet Take 1 tablet (100 mg total) by mouth daily.   gabapentin (NEURONTIN) 600 MG tablet Take 600 mg by mouth 3 (three) times daily.      Medical Decision Making  Patient with MDD/anxiety and EtOH relapse at risk of suicide in context  of biopsychosocial stressors.     Recommendations  Patient has an emergency psychiatric condition and will be voluntarily admitted to [facility] pending medical evaluation of subacute right foot wound which may require urgent care.  Nicklas Barns, MD 12/09/23  11:49 AM

## 2023-12-10 DIAGNOSIS — E119 Type 2 diabetes mellitus without complications: Secondary | ICD-10-CM

## 2023-12-10 DIAGNOSIS — F332 Major depressive disorder, recurrent severe without psychotic features: Secondary | ICD-10-CM | POA: Diagnosis not present

## 2023-12-10 DIAGNOSIS — F1020 Alcohol dependence, uncomplicated: Secondary | ICD-10-CM | POA: Diagnosis not present

## 2023-12-10 LAB — GLUCOSE, CAPILLARY
Glucose-Capillary: 271 mg/dL — ABNORMAL HIGH (ref 70–99)
Glucose-Capillary: 283 mg/dL — ABNORMAL HIGH (ref 70–99)
Glucose-Capillary: 439 mg/dL — ABNORMAL HIGH (ref 70–99)

## 2023-12-10 MED ORDER — METFORMIN HCL 500 MG PO TABS
1000.0000 mg | ORAL_TABLET | Freq: Two times a day (BID) | ORAL | Status: DC
  Administered 2023-12-10 – 2023-12-14 (×8): 1000 mg via ORAL
  Filled 2023-12-10 (×8): qty 2

## 2023-12-10 MED ORDER — GABAPENTIN 300 MG PO CAPS
600.0000 mg | ORAL_CAPSULE | Freq: Three times a day (TID) | ORAL | Status: DC
  Administered 2023-12-10 – 2023-12-14 (×13): 600 mg via ORAL
  Filled 2023-12-10 (×14): qty 2

## 2023-12-10 MED ORDER — DULOXETINE HCL 30 MG PO CPEP
30.0000 mg | ORAL_CAPSULE | Freq: Every day | ORAL | Status: DC
  Administered 2023-12-10 – 2023-12-11 (×2): 30 mg via ORAL
  Filled 2023-12-10 (×2): qty 1

## 2023-12-10 MED ORDER — AMOXICILLIN-POT CLAVULANATE 875-125 MG PO TABS
1.0000 | ORAL_TABLET | Freq: Two times a day (BID) | ORAL | Status: DC
  Administered 2023-12-10 – 2023-12-14 (×9): 1 via ORAL
  Filled 2023-12-10: qty 1
  Filled 2023-12-10: qty 2
  Filled 2023-12-10 (×8): qty 1

## 2023-12-10 MED ORDER — PANCRELIPASE (LIP-PROT-AMYL) 12000-38000 UNITS PO CPEP
12000.0000 [IU] | ORAL_CAPSULE | Freq: Three times a day (TID) | ORAL | Status: DC
  Administered 2023-12-10 – 2023-12-14 (×13): 12000 [IU] via ORAL
  Filled 2023-12-10 (×13): qty 1

## 2023-12-10 MED ORDER — INSULIN GLARGINE-YFGN 100 UNIT/ML ~~LOC~~ SOLN
22.0000 [IU] | Freq: Every day | SUBCUTANEOUS | Status: DC
  Administered 2023-12-10 – 2023-12-11 (×2): 22 [IU] via SUBCUTANEOUS

## 2023-12-10 MED ORDER — INSULIN GLARGINE-YFGN 100 UNIT/ML ~~LOC~~ SOLN
22.0000 [IU] | Freq: Every day | SUBCUTANEOUS | Status: DC
Start: 2023-12-10 — End: 2023-12-10

## 2023-12-10 MED ORDER — INSULIN ASPART 100 UNIT/ML IJ SOLN
5.0000 [IU] | Freq: Once | INTRAMUSCULAR | Status: AC
  Administered 2023-12-10: 5 [IU] via SUBCUTANEOUS

## 2023-12-10 MED ORDER — INSULIN ASPART 100 UNIT/ML IJ SOLN
0.0000 [IU] | Freq: Three times a day (TID) | INTRAMUSCULAR | Status: DC
  Administered 2023-12-10 (×2): 8 [IU] via SUBCUTANEOUS
  Administered 2023-12-11: 5 [IU] via SUBCUTANEOUS
  Administered 2023-12-11: 11 [IU] via SUBCUTANEOUS
  Administered 2023-12-11: 8 [IU] via SUBCUTANEOUS
  Administered 2023-12-12: 11 [IU] via SUBCUTANEOUS
  Administered 2023-12-12: 2 [IU] via SUBCUTANEOUS
  Administered 2023-12-12 – 2023-12-13 (×3): 15 [IU] via SUBCUTANEOUS
  Administered 2023-12-13: 8 [IU] via SUBCUTANEOUS
  Administered 2023-12-14: 11 [IU] via SUBCUTANEOUS

## 2023-12-10 MED ORDER — EMPAGLIFLOZIN 10 MG PO TABS
10.0000 mg | ORAL_TABLET | Freq: Every day | ORAL | Status: DC
  Administered 2023-12-11 – 2023-12-14 (×4): 10 mg via ORAL
  Filled 2023-12-10 (×4): qty 1

## 2023-12-10 MED ORDER — BUSPIRONE HCL 5 MG PO TABS
10.0000 mg | ORAL_TABLET | Freq: Three times a day (TID) | ORAL | Status: DC
  Administered 2023-12-10 – 2023-12-14 (×13): 10 mg via ORAL
  Filled 2023-12-10 (×3): qty 2
  Filled 2023-12-10: qty 1
  Filled 2023-12-10 (×10): qty 2

## 2023-12-10 MED ORDER — AMLODIPINE BESYLATE 10 MG PO TABS
10.0000 mg | ORAL_TABLET | Freq: Every day | ORAL | Status: DC
  Administered 2023-12-10 – 2023-12-14 (×5): 10 mg via ORAL
  Filled 2023-12-10 (×5): qty 1

## 2023-12-10 MED ORDER — ATORVASTATIN CALCIUM 40 MG PO TABS
40.0000 mg | ORAL_TABLET | Freq: Every day | ORAL | Status: DC
Start: 2023-12-10 — End: 2023-12-14
  Administered 2023-12-10 – 2023-12-14 (×5): 40 mg via ORAL
  Filled 2023-12-10 (×5): qty 1

## 2023-12-10 NOTE — Group Note (Signed)
 Group Topic: Healthy Self Image and Positive Change  Group Date: 12/10/2023 Start Time: 1610 End Time: 2000 Facilitators: Wendall Halls B  Department: [facility]  Number of Participants: 7  Group Focus: abuse issues, activities of daily living skills, affirmation, anxiety, check in, chemical dependency issues, clarity of thought, communication, coping skills, daily focus, depression, family, feeling awareness/expression, forgiveness, goals/reality orientation, healthy friendships, music therapy, problem solving, relapse prevention, relaxation, self-awareness, self-esteem, and substance abuse education Treatment Modality:  Leisure Development Interventions utilized were leisure development Purpose: enhance coping skills, express feelings, and relapse prevention strategies  Name: Trai Nash Date of Birth: 1976/01/17  MR: [id number]    Level of Participation: PT DID NOT ATTEND GROUPS Quality of Participation: cooperative Interactions with others: gave feedback Mood/Affect: appropriate Triggers (if applicable): NA Cognition: coherent/clear Progress: Gaining insight Response: NA Plan: patient will be encouraged to keep going to groups  Patients Problems:  Patient Active Problem List   Diagnosis Date Noted   Moderate episode of recurrent major depressive disorder (HCC) 12/09/2023   Suicidal ideation 12/09/2023   Alcohol  use disorder, severe, dependence (HCC) 12/09/2023   Panic disorder without agoraphobia 12/09/2023   MDD (major depressive disorder), recurrent episode (HCC) 12/09/2023

## 2023-12-10 NOTE — Group Note (Signed)
 Group Topic: Understanding Self  Group Date: 12/10/2023 Start Time: 1200 End Time: 1230 Facilitators: Denzil Flatten, RN  Department: [facility]  Number of Participants: 9  Group Focus: coping skills Treatment Modality:  Psychoeducation Interventions utilized were problem solving Purpose: trigger / craving management  Name: Joseph Cruz Date of Birth: 1976-01-19  MR: [id number]    Level of Participation: active Quality of Participation: cooperative Interactions with others: gave feedback Mood/Affect: appropriate Triggers (if applicable): "when listening to others in my head" Cognition: coherent/clear Progress: Significant Response: "I like to meditate to clear my head" Plan: patient will be encouraged to utilize identified coping skills for sx management  Patients Problems:  Patient Active Problem List   Diagnosis Date Noted   Moderate episode of recurrent major depressive disorder (HCC) 12/09/2023   Suicidal ideation 12/09/2023   Alcohol  use disorder, severe, dependence (HCC) 12/09/2023   Panic disorder without agoraphobia 12/09/2023   MDD (major depressive disorder), recurrent episode (HCC) 12/09/2023

## 2023-12-10 NOTE — Group Note (Signed)
 Group Topic: Change and Accountability  Group Date: 12/10/2023 Start Time: 1500 End Time: 1600 Facilitators: Elfredia Grippe, NT  Department: [facility]  Number of Participants: 8  Group Focus: goals/reality orientation Treatment Modality:  Psychoeducation Interventions utilized were patient education and reality testing Purpose: increase insight  Name: Joseph Cruz Date of Birth: 1976-05-11  MR: [id number]    Level of Participation: active Quality of Participation: attentive Interactions with others: gave feedback Mood/Affect: appropriate Triggers (if applicable): n/a Cognition: concrete Progress: Moderate Response: pts goal for the next year is to get his own apartment Plan: follow-up needed  Patients Problems:  Patient Active Problem List   Diagnosis Date Noted   Moderate episode of recurrent major depressive disorder (HCC) 12/09/2023   Suicidal ideation 12/09/2023   Alcohol  use disorder, severe, dependence (HCC) 12/09/2023   Panic disorder without agoraphobia 12/09/2023   MDD (major depressive disorder), recurrent episode (HCC) 12/09/2023

## 2023-12-10 NOTE — ED Notes (Signed)
 Pt is in the bedroom sleeping comfortably on the bed. NAD. environment secured.  Will keep monitoring for safety

## 2023-12-10 NOTE — ED Provider Notes (Addendum)
  Joseph Cruz is a 48 y/o male with a medical history including Alcohol  Use Disorder, Hypertension, Diabetes, Panic Disorder, and a recent foot amputation, presented voluntarily to [facility] due to suicidal ideation. [facility] for evaluation of a post-amputation foot wound.  The patient returned to [facility] following medical clearance from [facility]. This NP met with the patient face-to-face and reviewed his chart upon his return. The patient is alert and oriented x4. He is cooperative, with clear and coherent speech, and maintains appropriate eye contact. Mood is depressed, and affect is appropriate. Thought processes are logical and goal-directed, with no signs of delusions or hallucinations. He denies active suicidal ideation. Memory is intact, and judgment and insight are fair. No signs of agitation or psychomotor changes are noted.   Per EDP:The wound shows no signs of acute infection, and initial lab results, including ESR and CRP, were within normal limits. X-rays did not reveal any signs of osteomyelitis, and there is low concern for an occult deep space infection or abscess. The patient has been prescribed Augmentin 875 mg twice daily for 10 days as a precautionary measure to prevent infection. Pt recommended to avoid further weight-bearing on the affected foot and follow up with podiatry for ongoing wound care, with close monitoring for any signs of progression.

## 2023-12-10 NOTE — ED Notes (Signed)
 Patient is cooperative and compliant with staff directions, social on approach and initiates interactions with staff and peers. Patient states that his mood is "good" and his appetite is normal. He denies and homicidal or suicidal ideations as well as any hallucinations and says he is in no pain. Will continue to provide support, redirection, and further interventions as needed.

## 2023-12-10 NOTE — ED Notes (Signed)
 Pt sleeping in no acute distress. RR even and unlabored. Environment secured. Will continue to monitor for safety.

## 2023-12-10 NOTE — ED Notes (Addendum)
 Patient A&Ox4. Denies intent to harm self/others when asked. Denies A/VH. Patient denies any physical complaints when asked. No acute distress noted. Pt reports being ready to go back to rehab facility on Thursday. Support and encouragement provided. Routine safety checks conducted according to facility protocol. Encouraged patient to notify staff if thoughts of harm toward self or others arise. Provided education on wound care management to bottom of R foot and eating a healthy diet low in carbs. Patient verbalize understanding and agreement. Will continue to monitor for safety.

## 2023-12-10 NOTE — ED Notes (Signed)
 Patient appeared to be sleeping, respirations present, no distress.

## 2023-12-10 NOTE — ED Provider Notes (Signed)
 Facility Based Crisis Admission H&P  Date: 12/10/23 Patient Name: Joseph Cruz MRN: [id number] Chief Complaint: substance use  Diagnoses:  Final diagnoses:  Alcohol  use disorder    HPI:  The patient is a 48 year old male with a history of alcohol  use disorder.  Last psychiatric hospitalization in 2022 for alcohol  abuse.  The patient presented to the [facility] on the present occasion reporting suicidal thoughts after relapse.  He is currently admitted to the facility based crisis for further treatment.  Pertinent medical comorbidities include poorly controlled diabetes.  The patient states that he was at a [facility] [place]  He reports being kicked out of the program after several months because of inappropriate conversations with a male peer.  The patient states that he is able to go back to the program on Thursday.  He desires a door-to-door transfer.  I told him that this will be left to the discretion of the weekday team.  The patient reports some social support from his parents, though he says his father told him to commit suicide after he relapsed.  The patient denies experiencing suicidal thoughts presently.  He reports significant depressed mood.  He reports consuming alcohol  only on 1 occasion over the past few months (the one day he relapsed, which he says was 4/25).  As would be expected, he denies experiencing any withdrawal symptoms.  PHQ 2-9:  Flowsheet Row ED from 12/09/2023 in [facility] Most recent reading at 12/09/2023  9:30 PM ED from 12/09/2023 in [facility] Most recent reading at 12/09/2023 11:33 AM  Thoughts that you would be better off dead, or of hurting yourself in some way More than half the days More than half the days  PHQ-9 Total Score 17 15       Flowsheet Row ED from 12/09/2023 in [facility] Most recent reading at 12/09/2023 11:34 PM ED from 12/09/2023 in [facility] Emergency Department at [facility] Most recent reading at 12/09/2023  3:31 PM ED from 12/09/2023 in [facility] Most recent reading at 12/09/2023 11:33 AM  C-SSRS RISK CATEGORY Low Risk No Risk High Risk       Screenings    Flowsheet Row Most Recent Value  CIWA-Ar Total 0       Total Time spent with patient: 20 min Psychiatric Specialty Exam: Physical Exam Constitutional:      Appearance: the patient is not toxic-appearing.  Pulmonary:     Effort: Pulmonary effort is normal.  Neurological:     General: No focal deficit present.     Mental Status: the patient is alert and oriented to person, place, and time.   Review of Systems  Respiratory:  Negative for shortness of breath.   Cardiovascular:  Negative for chest pain.  Gastrointestinal:  Negative for abdominal pain, constipation, diarrhea, nausea and vomiting.  Neurological:  Negative for headaches.      BP (!) 125/93 (BP Location: Left Arm)   Pulse 79   Temp 98.5 F (36.9 C) (Oral)   Resp 20   SpO2 99%   General Appearance: Fairly Groomed  Eye Contact:  Good  Speech:  Clear and Coherent  Volume:  Normal  Mood:  Euthymic  Affect:  Congruent  Thought Process:  Coherent  Orientation:  Full (Time, Place, and Person)  Thought Content: Logical   Suicidal Thoughts:  No  Homicidal Thoughts:  No  Memory:  Immediate;   Good  Judgement:  fair  Insight:  fair  Psychomotor Activity:  Normal  Concentration:  Concentration: Good  Recall:  Good  Fund of Knowledge: Good  Language: Good  Akathisia:  No  Handed:  not assessed  AIMS (if indicated): not done  Assets:  Communication Skills Desire for Improvement Leisure time Physical Health  ADL's:  Intact  Cognition: WNL  Sleep:  Fair     Nutritional Assessment (For OBS and [facility] admissions only) Has the patient had a weight loss or gain of 10 pounds  or more in the last 3 months?: Yes Has the patient had a decrease in food intake/or appetite?: Yes Does the patient have dental problems?: No Does the patient have eating habits or behaviors that may be indicators of an eating disorder including binging or inducing vomiting?: No Has the patient been eating poorly because of a decreased appetite?: 0 (Insulin-dependent so regulating intake to avoid hypglycemia)      Blood pressure (!) 125/93, pulse 79, temperature 98.5 F (36.9 C), temperature source Oral, resp. rate 20, SpO2 99%. There is no height or weight on file to calculate BMI.  Past Psychiatric History: as above   Is the patient at risk to self? no Has the patient been a risk to self in the past 6 months? no Has the patient been a risk to self within the distant past? no Is the patient a risk to others? no Has the patient been a risk to others in the past 6 months? no Has the patient been a risk to others within the distant past? no  Past Medical History: as above Family History: as above Social History: no additional history, as above  Last Labs:  Admission on 12/09/2023, Discharged on 12/09/2023  Component Date Value Ref Range Status   SARS Coronavirus 2 by RT PCR 12/09/2023 NEGATIVE  NEGATIVE Final   Performed at [facility], [address], [place], [place] [address]   WBC 12/09/2023 8.6  4.0 - 10.5 K/uL Final   RBC 12/09/2023 4.91  4.22 - 5.81 MIL/uL Final   Hemoglobin 12/09/2023 14.1  13.0 - 17.0 g/dL Final   HCT 60/45/4098 41.8  39.0 - 52.0 % Final   MCV 12/09/2023 85.1  80.0 - 100.0 fL Final   MCH 12/09/2023 28.7  26.0 - 34.0 pg Final   MCHC 12/09/2023 33.7  30.0 - 36.0 g/dL Final   RDW 11/91/4782 12.6  11.5 - 15.5 % Final   Platelets 12/09/2023 345  150 - 400 K/uL Final   nRBC 12/09/2023 0.0  0.0 - 0.2 % Final   Neutrophils Relative % 12/09/2023 73  % Final   Neutro Abs 12/09/2023 6.3  1.7 - 7.7 K/uL Final   Lymphocytes Relative 12/09/2023 19  % Final    Lymphs Abs 12/09/2023 1.6  0.7 - 4.0 K/uL Final   Monocytes Relative 12/09/2023 6  % Final   Monocytes Absolute 12/09/2023 0.5  0.1 - 1.0 K/uL Final   Eosinophils Relative 12/09/2023 1  % Final   Eosinophils Absolute 12/09/2023 0.1  0.0 - 0.5 K/uL Final   Basophils Relative 12/09/2023 1  % Final   Basophils Absolute 12/09/2023 0.1  0.0 - 0.1 K/uL Final   Immature Granulocytes 12/09/2023 0  % Final   Abs Immature Granulocytes 12/09/2023 0.03  0.00 - 0.07 K/uL Final   Performed at [facility], [address], [place], [place] [address]   Sodium  12/09/2023 136  135 - 145 mmol/L Final   Potassium 12/09/2023 3.7  3.5 - 5.1 mmol/L Final   Chloride 12/09/2023 97 (L)  98 - 111 mmol/L Final   CO2 12/09/2023 27  22 - 32 mmol/L Final   Glucose, Bld 12/09/2023 193 (H)  70 - 99 mg/dL Final   Glucose reference range applies only to samples taken after fasting for at least 8 hours.   BUN 12/09/2023 14  6 - 20 mg/dL Final   Creatinine, Ser 12/09/2023 1.01  0.61 - 1.24 mg/dL Final   Calcium 16/05/9603 10.1  8.9 - 10.3 mg/dL Final   Total Protein 54/04/8118 8.4 (H)  6.5 - 8.1 g/dL Final   Albumin 14/78/2956 4.2  3.5 - 5.0 g/dL Final   AST 21/30/8657 35  15 - 41 U/L Final   ALT 12/09/2023 31  0 - 44 U/L Final   Alkaline Phosphatase 12/09/2023 126  38 - 126 U/L Final   Total Bilirubin 12/09/2023 0.7  0.0 - 1.2 mg/dL Final   GFR, Estimated 12/09/2023 >60  >60 mL/min Final   Comment: (NOTE) Calculated using the CKD-EPI Creatinine Equation (2021)    Anion gap 12/09/2023 12  5 - 15 Final   Performed at [facility], [address], [place], [place] [address]   Hgb A1c MFr Bld 12/09/2023 8.3 (H)  4.8 - 5.6 % Final   Comment: (NOTE) Pre diabetes:          5.7%-6.4%  Diabetes:              >6.4%  Glycemic control for   <7.0% adults with diabetes    Mean Plasma Glucose 12/09/2023 191.51  mg/dL Final   Performed at [facility], [address], [place], [place] [address]    Magnesium 12/09/2023 1.8  1.7 - 2.4 mg/dL Final   Performed at [facility], [address], [place], [place] [address]   Alcohol , Ethyl (B) 12/09/2023 <15  <15 mg/dL Final   Comment: Please note change in reference range. (NOTE) For medical purposes only. Performed at [facility], [address], [place], [place] [address]    Cholesterol 12/09/2023 122  0 - 200 mg/dL Final   Triglycerides 72/53/6644 65  <150 mg/dL Final   HDL 03/47/4259 49  >40 mg/dL Final   Total CHOL/HDL Ratio 12/09/2023 2.5  RATIO Final   VLDL 12/09/2023 13  0 - 40 mg/dL Final   LDL Cholesterol 12/09/2023 60  0 - 99 mg/dL Final   Comment:        Total Cholesterol/HDL:CHD Risk Coronary Heart Disease Risk Table                     Men   Women  1/2 Average Risk   3.4   3.3  Average Risk       5.0   4.4  2 X Average Risk   9.6   7.1  3 X Average Risk  23.4   11.0        Use the calculated Patient Ratio above and the CHD Risk Table to determine the patient's CHD Risk.        ATP III CLASSIFICATION (LDL):  <100     mg/dL   Optimal  563-875  mg/dL   Near or Above                    Optimal  130-159  mg/dL  Borderline  160-189  mg/dL   High  >161     mg/dL   Very High Performed at [facility], [address], [place], [place] [address]    TSH 12/09/2023 2.109  0.350 - 4.500 uIU/mL Final   Comment: Performed by a 3rd Generation assay with a functional sensitivity of <=0.01 uIU/mL. Performed at [facility], [address] [place], [place] [address]    Color, Urine 12/09/2023 YELLOW  YELLOW Final   APPearance 12/09/2023 CLEAR  CLEAR Final   Specific Gravity, Urine 12/09/2023 1.022  1.005 - 1.030 Final   pH 12/09/2023 7.0  5.0 - 8.0 Final   Glucose, UA 12/09/2023 >=500 (A)  NEGATIVE mg/dL Final   Hgb urine dipstick 12/09/2023 NEGATIVE  NEGATIVE Final   Bilirubin Urine 12/09/2023 NEGATIVE  NEGATIVE Final   Ketones, ur 12/09/2023 NEGATIVE  NEGATIVE mg/dL Final   Protein, ur  11/91/4782 NEGATIVE  NEGATIVE mg/dL Final   Nitrite 95/62/1308 NEGATIVE  NEGATIVE Final   Leukocytes,Ua 12/09/2023 NEGATIVE  NEGATIVE Final   RBC / HPF 12/09/2023 0-5  0 - 5 RBC/hpf Final   WBC, UA 12/09/2023 0-5  0 - 5 WBC/hpf Final   Bacteria, UA 12/09/2023 NONE SEEN  NONE SEEN Final   Squamous Epithelial / HPF 12/09/2023 0-5  0 - 5 /HPF Final   Performed at [facility], [address], [place], [place] [address]   POC Amphetamine UR 12/09/2023 None Detected  NONE DETECTED (Cut Off Level 1000 ng/mL) Final   POC Secobarbital (BAR) 12/09/2023 None Detected  NONE DETECTED (Cut Off Level 300 ng/mL) Final   POC Buprenorphine (BUP) 12/09/2023 None Detected  NONE DETECTED (Cut Off Level 10 ng/mL) Final   POC Oxazepam (BZO) 12/09/2023 None Detected  NONE DETECTED (Cut Off Level 300 ng/mL) Final   POC Cocaine UR 12/09/2023 None Detected  NONE DETECTED (Cut Off Level 300 ng/mL) Final   POC Methamphetamine UR 12/09/2023 None Detected  NONE DETECTED (Cut Off Level 1000 ng/mL) Final   POC Morphine 12/09/2023 None Detected  NONE DETECTED (Cut Off Level 300 ng/mL) Final   POC Methadone UR 12/09/2023 None Detected  NONE DETECTED (Cut Off Level 300 ng/mL) Final   POC Oxycodone UR 12/09/2023 None Detected  NONE DETECTED (Cut Off Level 100 ng/mL) Final   POC Marijuana UR 12/09/2023 None Detected  NONE DETECTED (Cut Off Level 50 ng/mL) Final   Sed Rate 12/09/2023 15  0 - 16 mm/hr Final   Performed at [facility], [address] [place], [place] [address]   CRP 12/09/2023 0.8  <1.0 mg/dL Final   Performed at [facility], [address] [place], [place] [address]   Glucose-Capillary 12/09/2023 195 (H)  70 - 99 mg/dL Final   Glucose reference range applies only to samples taken after fasting for at least 8 hours.    Allergies: Patient has no known allergies.  Medications:  Facility Ordered Medications  Medication   [COMPLETED] amLODipine (NORVASC) tablet 10 mg   [COMPLETED]  amoxicillin-clavulanate (AUGMENTIN) 875-125 MG per tablet 1 tablet   acetaminophen (TYLENOL) tablet 650 mg   alum & mag hydroxide-simeth (MAALOX/MYLANTA) 200-200-20 MG/5ML suspension 30 mL   magnesium hydroxide (MILK OF MAGNESIA) suspension 30 mL   haloperidol (HALDOL) tablet 5 mg   And   diphenhydrAMINE  (BENADRYL ) capsule 50 mg   haloperidol lactate (HALDOL) injection 5 mg   And   diphenhydrAMINE  (BENADRYL ) injection 50 mg   And  LORazepam  (ATIVAN ) injection 2 mg   haloperidol lactate (HALDOL) injection 10 mg   And   diphenhydrAMINE  (BENADRYL ) injection 50 mg   And   LORazepam  (ATIVAN ) injection 2 mg   traZODone  (DESYREL ) tablet 50 mg   thiamine (VITAMIN B1) tablet 100 mg   multivitamin with minerals tablet 1 tablet   LORazepam  (ATIVAN ) tablet 1 mg   hydrOXYzine (ATARAX) tablet 25 mg   loperamide (IMODIUM) capsule 2-4 mg   ondansetron  (ZOFRAN -ODT) disintegrating tablet 4 mg   amoxicillin-clavulanate (AUGMENTIN) 875-125 MG per tablet 1 tablet   amLODipine (NORVASC) tablet 10 mg   atorvastatin (LIPITOR) tablet 40 mg   busPIRone (BUSPAR) tablet 10 mg   DULoxetine (CYMBALTA) DR capsule 30 mg   [START ON 12/11/2023] empagliflozin (JARDIANCE) tablet 10 mg   gabapentin (NEURONTIN) capsule 600 mg   metFORMIN (GLUCOPHAGE) tablet 1,000 mg   lipase/protease/amylase (CREON) capsule 12,000 Units   insulin glargine-yfgn (SEMGLEE) injection 22 Units   insulin aspart (novoLOG) injection 0-15 Units   PTA Medications  Medication Sig   carvedilol (COREG) 6.25 MG tablet Take 6.25 mg by mouth 2 (two) times daily with a meal.   lisinopril (ZESTRIL) 10 MG tablet Take 10 mg by mouth daily.   atorvastatin (LIPITOR) 40 MG tablet Take 40 mg by mouth daily.   empagliflozin (JARDIANCE) 10 MG TABS tablet Take 10 mg by mouth daily with breakfast.   insulin glargine (LANTUS SOLOSTAR) 100 UNIT/ML Solostar Pen Inject 22 Units into the skin daily.   amLODipine (NORVASC) 10 MG tablet Take 10 mg by mouth  daily.   busPIRone (BUSPAR) 10 MG tablet Take 10 mg by mouth 3 (three) times daily.   DULoxetine (CYMBALTA) 30 MG capsule Take 30 mg by mouth daily.   naloxone (NARCAN) nasal spray 4 mg/0.1 mL Place 1 spray into the nose once.   Pancrelipase, Lip-Prot-Amyl, 3000-9500 units CPEP Take 3 capsules by mouth 3 (three) times daily.   linagliptin (TRADJENTA) 5 MG TABS tablet Take 5 mg by mouth daily.    Long Term Goals: Improvement in symptoms so as ready for discharge  Short Term Goals: Patient will verbalize feelings in meetings with treatment team members., Patient will attend at least of 50% of the groups daily., Pt will complete the PHQ9 on admission, day 3 and discharge., Patient will participate in completing the [place] Suicide Severity Rating Scale, Patient will score a low risk of violence for 24 hours prior to discharge, and Patient will take medications as prescribed daily.  Medical Decision Making   Status: Voluntary  Alcohol  use disorder -no withdrawal expected based on the patient's reported alcohol  use - CIWA with as needed Ativan  - Residential rehab placement  Depression - Restart home Cymbalta and buspirone  Diabetes, most recent A1c 8.1 - CBGs in the high 100s or low 200s during his time here so far - Foot ulceration was examined in the emergency department.  Started on antibiotics and returned to this facility.  The patient needs to follow-up with podiatry. - Start glargine 22 units daily, monitor sliding scale insulin - Restart Jardiance and metformin  Other home medications restarted as ordered   Labs and EKG reviewed, unremarkable   Recommendations  I certify that this patient does not appear to have an emergency medical condition.  Marilou Showman, MD PGY-3

## 2023-12-10 NOTE — ED Provider Notes (Deleted)
  Joseph Cruz with a medical history including Alcohol  Use Disorder, Hypertension, Diabetes, Panic Disorder, and a recent foot amputation, presented voluntarily to [facility] due to suicidal ideation. [facility] for evaluation of a post-amputation foot wound. The patient returned to [facility] following medical clearance from [facility]. This NP met with the patient face-to-face and reviewed his chart upon his return. The patient is alert and oriented x4. He is cooperative, with clear and coherent speech, and maintains appropriate eye contact. Mood is depressed, and affect is appropriate. Thought processes are logical and goal-directed, with no signs of delusions or hallucinations. He denies current suicidal ideation, though has a history of panic disorder and past suicidal thoughts. Memory is intact, and judgment and insight are fair, as he recognizes the need for follow-up with podiatry and adherence to prescribed antibiotics. No signs of agitation or psychomotor changes are noted. The patient is focused on his medical care and expresses no acute psychiatric distress at this time.  Per EDP:The wound shows no signs of acute infection, and initial lab results, including ESR and CRP, were within normal limits. X-rays did not reveal any signs of osteomyelitis, and there is low concern for an occult deep space infection or abscess. The patient has been prescribed Augmentin 875 mg twice daily for 10 days as a precautionary measure to prevent infection. Pt recommended to avoid further weight-bearing on the affected foot and follow up with podiatry for ongoing wound care, with close monitoring for any signs of progression.

## 2023-12-10 NOTE — ED Notes (Addendum)
 Joseph Cruz

## 2023-12-11 DIAGNOSIS — F332 Major depressive disorder, recurrent severe without psychotic features: Secondary | ICD-10-CM | POA: Diagnosis not present

## 2023-12-11 DIAGNOSIS — E119 Type 2 diabetes mellitus without complications: Secondary | ICD-10-CM | POA: Diagnosis not present

## 2023-12-11 DIAGNOSIS — F1020 Alcohol dependence, uncomplicated: Secondary | ICD-10-CM | POA: Diagnosis not present

## 2023-12-11 LAB — GLUCOSE, CAPILLARY
Glucose-Capillary: 259 mg/dL — ABNORMAL HIGH (ref 70–99)
Glucose-Capillary: 290 mg/dL — ABNORMAL HIGH (ref 70–99)
Glucose-Capillary: 231 mg/dL — ABNORMAL HIGH (ref 70–99)

## 2023-12-11 MED ORDER — DULOXETINE HCL 60 MG PO CPEP
60.0000 mg | ORAL_CAPSULE | Freq: Every day | ORAL | Status: DC
Start: 2023-12-12 — End: 2023-12-14
  Administered 2023-12-12 – 2023-12-14 (×3): 60 mg via ORAL
  Filled 2023-12-11 (×3): qty 1

## 2023-12-11 MED ORDER — PRAZOSIN HCL 1 MG PO CAPS
1.0000 mg | ORAL_CAPSULE | Freq: Every day | ORAL | Status: DC
  Administered 2023-12-11 – 2023-12-13 (×3): 1 mg via ORAL
  Filled 2023-12-11 (×3): qty 1

## 2023-12-11 NOTE — Discharge Planning (Signed)
 SW met with patient to discuss disposition plans and he stated that [facility] but would rather to get into [facility] in [place]. Patient stated that he has already started looking into this program and spoke with [facility] in Admissions. He stated he had her cell number and would provide this to SW. SW made phone call to number listed on line and was not able to leave a message. Will continue to follow up with referral to [facility] as another option. Patient stated if he couldn't get into this program he [facility]. Will continue to follow.

## 2023-12-11 NOTE — ED Notes (Signed)
 Patient alert & oriented x4. Denies intent to harm self or others when asked. Denies A/VH. Patient denies any physical complaints when asked. No acute distress noted. Scheduled medications administered with no complications. Blood glucose levels being checked via patient's Dexcom. Support and encouragement provided. Routine safety checks conducted per facility protocol. Encouraged patient to notify staff if any thoughts of harm towards self or others arise. Patient verbalizes understanding and agreement.

## 2023-12-11 NOTE — ED Notes (Signed)
 Patient in the bedroom asleep with eyes closed. NAD. Respirations are even and unlabored. Environment secured per policy. Will monitoring for safety

## 2023-12-11 NOTE — ED Notes (Signed)
 Patient is sleeping. Respirations equal and unlabored, skin warm and dry. No change in assessment or acuity. Routine safety checks conducted according to facility protocol. Will continue to monitor for safety.

## 2023-12-11 NOTE — ED Notes (Signed)
 Patient resting with eyes closed in no apparent acute distress. Respirations even and unlabored. Environment secured. Safety checks in place according to facility policy.

## 2023-12-11 NOTE — Group Note (Signed)
 Group Topic: Positive Affirmations  Group Date: 12/11/2023 Start Time: 2000 End Time: 2030 Facilitators: Bonnie Butters  Department: [facility]  Number of Participants: 5  Group Focus: check in Treatment Modality:  Skills Training Interventions utilized were support Purpose: reinforce self-care  Name: Joseph Cruz Date of Birth: Jun 30, 1976  MR: [id number]    Level of Participation: active Quality of Participation: cooperative Interactions with others: gave feedback Mood/Affect: appropriate Triggers (if applicable):  Cognition: goal directed Progress: Gaining insight Response: Pt wants to go to long term treatment facility Plan: patient will be encouraged to follow up on goals  Patients Problems:  Patient Active Problem List   Diagnosis Date Noted   Moderate episode of recurrent major depressive disorder (HCC) 12/09/2023   Suicidal ideation 12/09/2023   Alcohol  use disorder, severe, dependence (HCC) 12/09/2023   Panic disorder without agoraphobia 12/09/2023   MDD (major depressive disorder), recurrent episode (HCC) 12/09/2023

## 2023-12-11 NOTE — ED Notes (Addendum)
 Patient is at the phone area, speaking with someone. NAD. Denies needing anything. Environment secured and safe. Will keep monitoring for safety.

## 2023-12-11 NOTE — Group Note (Signed)
 Group Topic: Healthy Self Image and Positive Change  Group Date: 12/11/2023 Start Time: 0930 End Time: 1015 Facilitators: Rozetta Corns  Department: [facility]  Number of Participants: 6  Group Focus: self-awareness Treatment Modality:  Psychoeducation Interventions utilized were patient education Purpose: increase insight  Name: Joseph Cruz Date of Birth: 1976/06/26  MR: [id number]    Level of Participation: moderate Quality of Participation: attentive, cooperative, engaged, motivated, and offered feedback Interactions with others: gave feedback Mood/Affect: appropriate and positive Triggers (if applicable): n/a Cognition: coherent/clear, concrete, goal directed, and insightful Progress: Gaining insight Response: n/a Plan: patient will be encouraged to continue to attend group  Patients Problems:  Patient Active Problem List   Diagnosis Date Noted   Moderate episode of recurrent major depressive disorder (HCC) 12/09/2023   Suicidal ideation 12/09/2023   Alcohol  use disorder, severe, dependence (HCC) 12/09/2023   Panic disorder without agoraphobia 12/09/2023   MDD (major depressive disorder), recurrent episode (HCC) 12/09/2023

## 2023-12-11 NOTE — ED Notes (Signed)
 CBG checked via patient's dexcom. 324.
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
## 2023-12-12 DIAGNOSIS — F1020 Alcohol dependence, uncomplicated: Secondary | ICD-10-CM | POA: Diagnosis not present

## 2023-12-12 DIAGNOSIS — E119 Type 2 diabetes mellitus without complications: Secondary | ICD-10-CM | POA: Diagnosis not present

## 2023-12-12 DIAGNOSIS — F332 Major depressive disorder, recurrent severe without psychotic features: Secondary | ICD-10-CM | POA: Diagnosis not present

## 2023-12-12 LAB — GLUCOSE, CAPILLARY
Glucose-Capillary: 322 mg/dL — ABNORMAL HIGH (ref 70–99)
Glucose-Capillary: 359 mg/dL — ABNORMAL HIGH (ref 70–99)
Glucose-Capillary: 146 mg/dL — ABNORMAL HIGH (ref 70–99)
Glucose-Capillary: 375 mg/dL — ABNORMAL HIGH (ref 70–99)

## 2023-12-12 MED ORDER — INSULIN GLARGINE-YFGN 100 UNIT/ML ~~LOC~~ SOLN
45.0000 [IU] | Freq: Every day | SUBCUTANEOUS | Status: DC
Start: 2023-12-12 — End: 2023-12-14
  Administered 2023-12-12 – 2023-12-14 (×3): 45 [IU] via SUBCUTANEOUS

## 2023-12-12 NOTE — Discharge Planning (Signed)
 This SW made [facility] to [facility] @ [phone] and spoke with Bridgette Campus who transferred call to [facility] in admission and left message with her to return call to this SW regarding pt with anticipated dc plans to return on Thursday with [facility] managed care services to transport. SW waiting to confirm patient's readmission. Will continue to follow up with these plans.

## 2023-12-12 NOTE — ED Notes (Signed)
 Pt is in the dayroom watching TV with peers. Pt denies SI/HI/AVH. Pt has no further complain.No acute distress noted. Will continue to monitor for safety and provide support.

## 2023-12-12 NOTE — ED Notes (Signed)
 Patient is in the bedroom sleeping  NAD. Respirations are even and unlabored. Environment secured per policy. Will monitoring for safety

## 2023-12-12 NOTE — ED Provider Notes (Signed)
 Behavioral Health Progress Note  Date and Time: 12/12/2023 11:03 AM Name: Joseph Cruz MRN:  [id number]  Subjective:   Joseph Cruz is a 48 y.o. male with a past history of alcohol  use disorder, hypertension, diabetes, panic disorder, and recent foot amputation admitted to [facility] due to suicidal ideation.   Patient was assessed in his room in no acute distress. On interview, patient presented alert and oriented to person, place, time, and situation. He reported his mood as "okay" and stated that he has been doing okay today. Over the last 24 hours, he reported attending his group session, watching television, and playing bingo. He endorsed sleeping poorly last night because of anxiety and stress related to his next steps after discharge. He endorsed a hearty appetite with adequate hydration. He reported his last bowel movement as this morning. He endorsed his anxiety as 10/10 and depression as 0/10 (with 10 being the most severe). He stated that his anxiety was high because of his uncertainty about his future in particular. He reported that his energy levels were low today because of his poor sleep and endorsed his feet hurting a little bit more than usual. He denied any SI, HI, and AVH, and was not responding to internal stimuli during our conversation. He denied any cravings for alcohol  as well as any withdrawal symptoms. He reported beneficial effects from his recent medication changes (increase in Cymbalta and addition Prazosin) without any side effects. He stated that he had not had a nightmare yesterday. He stated that he looked forward to hearing from the social worker about his ability to continue his treatment at [facility], stating it was a good fit for his future aspirations. [facility] reported being [facility] to [facility] and wanted more information about [facility] as a future option. He reported no other concerns.  Diagnosis:  Final diagnoses:  Alcohol  use disorder     Total Time spent with patient: 20 minutes  Past Psychiatric History: See H&P. Past Medical History: See H&P. Family History: See H&P. Family Psychiatric  History: See H&P. Social History: See H&P.  Additional Social History:   Sleep: Poor  Appetite:  Good  Current Medications:  Current Facility-Administered Medications  Medication Dose Route Frequency Provider Last Rate Last Admin   acetaminophen (TYLENOL) tablet 650 mg  650 mg Oral Q6H PRN Ajibola, Ene A, NP   650 mg at 12/11/23 2106   alum & mag hydroxide-simeth (MAALOX/MYLANTA) 200-200-20 MG/5ML suspension 30 mL  30 mL Oral Q4H PRN Ajibola, Ene A, NP       amLODipine (NORVASC) tablet 10 mg  10 mg Oral Daily Ajibola, Ene A, NP   10 mg at 12/12/23 0917   amoxicillin-clavulanate (AUGMENTIN) 875-125 MG per tablet 1 tablet  1 tablet Oral BID Ajibola, Ene A, NP   1 tablet at 12/12/23 0917   atorvastatin (LIPITOR) tablet 40 mg  40 mg Oral Daily Marilou Showman, MD   40 mg at 12/12/23 0917   busPIRone (BUSPAR) tablet 10 mg  10 mg Oral TID Marilou Showman, MD   10 mg at 12/12/23 0917   haloperidol (HALDOL) tablet 5 mg  5 mg Oral TID PRN Ajibola, Ene A, NP       And   diphenhydrAMINE  (BENADRYL ) capsule 50 mg  50 mg Oral TID PRN Ajibola, Ene A, NP   50 mg at 12/12/23 0006   haloperidol lactate (HALDOL) injection 5 mg  5 mg Intramuscular TID PRN Ajibola, Ene A, NP  And   diphenhydrAMINE  (BENADRYL ) injection 50 mg  50 mg Intramuscular TID PRN Ajibola, Ene A, NP       And   LORazepam  (ATIVAN ) injection 2 mg  2 mg Intramuscular TID PRN Ajibola, Ene A, NP       haloperidol lactate (HALDOL) injection 10 mg  10 mg Intramuscular TID PRN Ajibola, Ene A, NP       And   diphenhydrAMINE  (BENADRYL ) injection 50 mg  50 mg Intramuscular TID PRN Ajibola, Ene A, NP       And   LORazepam  (ATIVAN ) injection 2 mg  2 mg Intramuscular TID PRN Ajibola, Ene A, NP       DULoxetine (CYMBALTA) DR capsule 60 mg  60 mg Oral Daily Teron Blais B, MD   60  mg at 12/12/23 0917   empagliflozin (JARDIANCE) tablet 10 mg  10 mg Oral Q breakfast Marilou Showman, MD   10 mg at 12/12/23 0919   gabapentin (NEURONTIN) capsule 600 mg  600 mg Oral TID Marilou Showman, MD   600 mg at 12/12/23 0917   hydrOXYzine (ATARAX) tablet 25 mg  25 mg Oral Q6H PRN Ajibola, Ene A, NP   25 mg at 12/11/23 2106   insulin aspart (novoLOG) injection 0-15 Units  0-15 Units Subcutaneous TID WC Marilou Showman, MD   11 Units at 12/12/23 0916   insulin glargine-yfgn (SEMGLEE) injection 45 Units  45 Units Subcutaneous Daily Halden Phegley B, MD   45 Units at 12/12/23 0865   lipase/protease/amylase (CREON) capsule 12,000 Units  12,000 Units Oral TID Marilou Showman, MD   12,000 Units at 12/12/23 7846   loperamide (IMODIUM) capsule 2-4 mg  2-4 mg Oral PRN Ajibola, Ene A, NP       LORazepam  (ATIVAN ) tablet 1 mg  1 mg Oral Q6H PRN Ajibola, Ene A, NP       magnesium hydroxide (MILK OF MAGNESIA) suspension 30 mL  30 mL Oral Daily PRN Ajibola, Ene A, NP       metFORMIN (GLUCOPHAGE) tablet 1,000 mg  1,000 mg Oral BID WC Marilou Showman, MD   1,000 mg at 12/12/23 9629   multivitamin with minerals tablet 1 tablet  1 tablet Oral Daily Ajibola, Ene A, NP   1 tablet at 12/12/23 0917   ondansetron  (ZOFRAN -ODT) disintegrating tablet 4 mg  4 mg Oral Q6H PRN Ajibola, Ene A, NP       prazosin (MINIPRESS) capsule 1 mg  1 mg Oral QHS Kiefer Opheim B, MD   1 mg at 12/11/23 2106   thiamine (VITAMIN B1) tablet 100 mg  100 mg Oral Daily Ajibola, Ene A, NP   100 mg at 12/12/23 0917   traZODone  (DESYREL ) tablet 50 mg  50 mg Oral QHS PRN Ajibola, Ene A, NP   50 mg at 12/11/23 2105   Current Outpatient Medications  Medication Sig Dispense Refill   amLODipine (NORVASC) 10 MG tablet Take 10 mg by mouth daily.     amoxicillin-clavulanate (AUGMENTIN) 875-125 MG tablet Take 1 tablet by mouth 2 (two) times daily for 10 days. 20 tablet 0   atorvastatin (LIPITOR) 40 MG tablet Take 40 mg by mouth daily.     busPIRone  (BUSPAR) 10 MG tablet Take 10 mg by mouth 3 (three) times daily.     carvedilol (COREG) 6.25 MG tablet Take 6.25 mg by mouth 2 (two) times daily with a meal.     DULoxetine (CYMBALTA) 30 MG capsule Take 30 mg by mouth daily.  empagliflozin (JARDIANCE) 10 MG TABS tablet Take 10 mg by mouth daily with breakfast.     gabapentin (NEURONTIN) 600 MG tablet Take 600 mg by mouth 3 (three) times daily.     insulin aspart (NOVOLOG) 100 UNIT/ML injection Inject 0-9 Units into the skin 3 (three) times daily with meals. 10 mL 11   insulin glargine (LANTUS SOLOSTAR) 100 UNIT/ML Solostar Pen Inject 22 Units into the skin daily.     linagliptin (TRADJENTA) 5 MG TABS tablet Take 5 mg by mouth daily.     lisinopril (ZESTRIL) 10 MG tablet Take 10 mg by mouth daily.     metFORMIN (GLUCOPHAGE) 1000 MG tablet Take 1 tablet (1,000 mg total) by mouth 2 (two) times daily with a meal. 60 tablet 0   naloxone (NARCAN) nasal spray 4 mg/0.1 mL Place 1 spray into the nose once.     Pancrelipase, Lip-Prot-Amyl, 3000-9500 units CPEP Take 3 capsules by mouth 3 (three) times daily.      Labs  Lab Results:  Admission on 12/09/2023  Component Date Value Ref Range Status   Glucose-Capillary 12/10/2023 271 (H)  70 - 99 mg/dL Final   Glucose reference range applies only to samples taken after fasting for at least 8 hours.   Glucose-Capillary 12/10/2023 283 (H)  70 - 99 mg/dL Final   Glucose reference range applies only to samples taken after fasting for at least 8 hours.   Glucose-Capillary 12/10/2023 439 (H)  70 - 99 mg/dL Final   Glucose reference range applies only to samples taken after fasting for at least 8 hours.   Glucose-Capillary 12/11/2023 259 (H)  70 - 99 mg/dL Final   Glucose reference range applies only to samples taken after fasting for at least 8 hours.   Glucose-Capillary 12/11/2023 290 (H)  70 - 99 mg/dL Final   Glucose reference range applies only to samples taken after fasting for at least 8 hours.    Glucose-Capillary 12/11/2023 231 (H)  70 - 99 mg/dL Final   Glucose reference range applies only to samples taken after fasting for at least 8 hours.   Glucose-Capillary 12/12/2023 322 (H)  70 - 99 mg/dL Final   Glucose reference range applies only to samples taken after fasting for at least 8 hours.  Admission on 12/09/2023, Discharged on 12/09/2023  Component Date Value Ref Range Status   SARS Coronavirus 2 by RT PCR 12/09/2023 NEGATIVE  NEGATIVE Final   Performed at [facility], [address], [place], [place] [address]   WBC 12/09/2023 8.6  4.0 - 10.5 K/uL Final   RBC 12/09/2023 4.91  4.22 - 5.81 MIL/uL Final   Hemoglobin 12/09/2023 14.1  13.0 - 17.0 g/dL Final   HCT 60/45/4098 41.8  39.0 - 52.0 % Final   MCV 12/09/2023 85.1  80.0 - 100.0 fL Final   MCH 12/09/2023 28.7  26.0 - 34.0 pg Final   MCHC 12/09/2023 33.7  30.0 - 36.0 g/dL Final   RDW 11/91/4782 12.6  11.5 - 15.5 % Final   Platelets 12/09/2023 345  150 - 400 K/uL Final   nRBC 12/09/2023 0.0  0.0 - 0.2 % Final   Neutrophils Relative % 12/09/2023 73  % Final   Neutro Abs 12/09/2023 6.3  1.7 - 7.7 K/uL Final   Lymphocytes Relative 12/09/2023 19  % Final   Lymphs Abs 12/09/2023 1.6  0.7 - 4.0 K/uL Final   Monocytes Relative 12/09/2023 6  % Final   Monocytes Absolute 12/09/2023 0.5  0.1 - 1.0 K/uL Final   Eosinophils Relative 12/09/2023 1  % Final   Eosinophils Absolute 12/09/2023 0.1  0.0 - 0.5 K/uL Final   Basophils Relative 12/09/2023 1  % Final   Basophils Absolute 12/09/2023 0.1  0.0 - 0.1 K/uL Final   Immature Granulocytes 12/09/2023 0  % Final   Abs Immature Granulocytes 12/09/2023 0.03  0.00 - 0.07 K/uL Final   Performed at [facility], [address], [place], [place] [address]   Sodium 12/09/2023 136  135 - 145 mmol/L Final   Potassium 12/09/2023 3.7  3.5 - 5.1 mmol/L Final   Chloride 12/09/2023 97 (L)  98 - 111 mmol/L Final   CO2 12/09/2023 27  22 - 32 mmol/L Final   Glucose, Bld 12/09/2023 193 (H)   70 - 99 mg/dL Final   Glucose reference range applies only to samples taken after fasting for at least 8 hours.   BUN 12/09/2023 14  6 - 20 mg/dL Final   Creatinine, Ser 12/09/2023 1.01  0.61 - 1.24 mg/dL Final   Calcium 60/45/4098 10.1  8.9 - 10.3 mg/dL Final   Total Protein 11/91/4782 8.4 (H)  6.5 - 8.1 g/dL Final   Albumin 95/62/1308 4.2  3.5 - 5.0 g/dL Final   AST 65/78/4696 35  15 - 41 U/L Final   ALT 12/09/2023 31  0 - 44 U/L Final   Alkaline Phosphatase 12/09/2023 126  38 - 126 U/L Final   Total Bilirubin 12/09/2023 0.7  0.0 - 1.2 mg/dL Final   GFR, Estimated 12/09/2023 >60  >60 mL/min Final   Comment: (NOTE) Calculated using the CKD-EPI Creatinine Equation (2021)    Anion gap 12/09/2023 12  5 - 15 Final   Performed at [facility], [address] [place], [place] [address]   Hgb A1c MFr Bld 12/09/2023 8.3 (H)  4.8 - 5.6 % Final   Comment: (NOTE) Pre diabetes:          5.7%-6.4%  Diabetes:              >6.4%  Glycemic control for   <7.0% adults with diabetes    Mean Plasma Glucose 12/09/2023 191.51  mg/dL Final   Performed at [facility], [address], [place], [place] [address]   Magnesium 12/09/2023 1.8  1.7 - 2.4 mg/dL Final   Performed at [facility], [address], [place], [place] [address]   Alcohol , Ethyl (B) 12/09/2023 <15  <15 mg/dL Final   Comment: Please note change in reference range. (NOTE) For medical purposes only. Performed at [facility], [address], [place], [place] [address]    Cholesterol 12/09/2023 122  0 - 200 mg/dL Final   Triglycerides 64/40/3474 65  <150 mg/dL Final   HDL 25/95/6387 49  >40 mg/dL Final   Total CHOL/HDL Ratio 12/09/2023 2.5  RATIO Final   VLDL 12/09/2023 13  0 - 40 mg/dL Final   LDL Cholesterol 12/09/2023 60  0 - 99 mg/dL Final   Comment:        Total Cholesterol/HDL:CHD Risk Coronary Heart Disease Risk Table                     Men   Women  1/2 Average Risk   3.4   3.3  Average Risk        5.0   4.4  2 X Average Risk   9.6   7.1  3 X Average Risk  23.4   11.0        Use the calculated Patient Ratio above and the CHD Risk Table to determine the patient's CHD Risk.        ATP III CLASSIFICATION (LDL):  <100     mg/dL   Optimal  604-540  mg/dL   Near or Above                    Optimal  130-159  mg/dL   Borderline  981-191  mg/dL   High  >478     mg/dL   Very High Performed at [facility], [address] [place], [place] [address]    TSH 12/09/2023 2.109  0.350 - 4.500 uIU/mL Final   Comment: Performed by a 3rd Generation assay with a functional sensitivity of <=0.01 uIU/mL. Performed at [facility], [address] [place], [place] [address]    Color, Urine 12/09/2023 YELLOW  YELLOW Final   APPearance 12/09/2023 CLEAR  CLEAR Final   Specific Gravity, Urine 12/09/2023 1.022  1.005 - 1.030 Final   pH 12/09/2023 7.0  5.0 - 8.0 Final   Glucose, UA 12/09/2023 >=500 (A)  NEGATIVE mg/dL Final   Hgb urine dipstick 12/09/2023 NEGATIVE  NEGATIVE Final   Bilirubin Urine 12/09/2023 NEGATIVE  NEGATIVE Final   Ketones, ur 12/09/2023 NEGATIVE  NEGATIVE mg/dL Final   Protein, ur 57/84/6962 NEGATIVE  NEGATIVE mg/dL Final   Nitrite 95/28/4132 NEGATIVE  NEGATIVE Final   Leukocytes,Ua 12/09/2023 NEGATIVE  NEGATIVE Final   RBC / HPF 12/09/2023 0-5  0 - 5 RBC/hpf Final   WBC, UA 12/09/2023 0-5  0 - 5 WBC/hpf Final   Bacteria, UA 12/09/2023 NONE SEEN  NONE SEEN Final   Squamous Epithelial / HPF 12/09/2023 0-5  0 - 5 /HPF Final   Performed at [facility], [address] [place], [place] [address]   POC Amphetamine UR 12/09/2023 None Detected  NONE DETECTED (Cut Off Level 1000 ng/mL) Final   POC Secobarbital (BAR) 12/09/2023 None Detected  NONE DETECTED (Cut Off Level 300 ng/mL) Final   POC Buprenorphine (BUP) 12/09/2023 None Detected  NONE DETECTED (Cut Off Level 10 ng/mL) Final   POC Oxazepam (BZO) 12/09/2023 None Detected  NONE DETECTED (Cut Off Level 300  ng/mL) Final   POC Cocaine UR 12/09/2023 None Detected  NONE DETECTED (Cut Off Level 300 ng/mL) Final   POC Methamphetamine UR 12/09/2023 None Detected  NONE DETECTED (Cut Off Level 1000 ng/mL) Final   POC Morphine 12/09/2023 None Detected  NONE DETECTED (Cut Off Level 300 ng/mL) Final   POC Methadone UR 12/09/2023 None Detected  NONE DETECTED (Cut Off Level 300 ng/mL) Final   POC Oxycodone UR 12/09/2023 None Detected  NONE DETECTED (Cut Off Level 100 ng/mL) Final   POC Marijuana UR 12/09/2023 None Detected  NONE DETECTED (Cut Off Level 50 ng/mL) Final   Sed Rate 12/09/2023 15  0 - 16 mm/hr Final   Performed at [facility], [address] [place], [place] [address]   CRP 12/09/2023 0.8  <1.0 mg/dL Final   Performed at [facility], [address] [place], [place] [address]   Glucose-Capillary 12/09/2023 195 (H)  70 - 99 mg/dL Final   Glucose reference range applies only to samples taken after fasting for at least 8 hours.    Blood Alcohol  level:  Lab Results  Component Value Date   ETH <15  12/09/2023   ETH <10 12/16/2020    Metabolic Disorder Labs: Lab Results  Component Value Date   HGBA1C 8.3 (H) 12/09/2023   MPG 191.51 12/09/2023   No results found for: "PROLACTIN" Lab Results  Component Value Date   CHOL 122 12/09/2023   TRIG 65 12/09/2023   HDL 49 12/09/2023   CHOLHDL 2.5 12/09/2023   VLDL 13 12/09/2023   LDLCALC 60 12/09/2023    Therapeutic Lab Levels: No results found for: "LITHIUM" No results found for: "VALPROATE" No results found for: "CBMZ"  Physical Findings   PHQ2-9    Flowsheet Row ED from 12/09/2023 in [facility] Most recent reading at 12/09/2023  9:30 PM ED from 12/09/2023 in [facility] Most recent reading at 12/09/2023 11:33 AM Counselor from 11/18/2020 in [facility] Most recent reading at 11/18/2020  2:24 PM  PHQ-2 Total Score 2 2 0  PHQ-9 Total Score  17 15 --      Flowsheet Row ED from 12/09/2023 in [facility] Most recent reading at 12/09/2023 11:34 PM ED from 12/09/2023 in [facility] at [facility] Most recent reading at 12/09/2023  3:31 PM ED from 12/09/2023 in [facility] Most recent reading at 12/09/2023 11:33 AM  C-SSRS RISK CATEGORY Low Risk No Risk High Risk        Musculoskeletal  Strength & Muscle Tone: within normal limits Gait & Station: normal Patient leans: N/A  Psychiatric Specialty Exam  Presentation  General Appearance:  Appropriate for Environment; Fairly Groomed  Eye Contact: Poor  Speech: Clear and Coherent; Normal Rate  Speech Volume: Normal  Handedness:No data recorded  Mood and Affect  Mood: Euthymic ("Okay")  Affect: Appropriate; Congruent   Thought Process  Thought Processes: Coherent; Goal Directed; Linear  Descriptions of Associations:Intact  Orientation:Full (Time, Place and Person)  Thought Content:Logical  Diagnosis of Schizophrenia or Schizoaffective disorder in past: No  Hallucinations:Hallucinations: None  Ideas of Reference:None  Suicidal Thoughts:Suicidal Thoughts: No  Homicidal Thoughts:Homicidal Thoughts: No   Sensorium  Memory: Immediate Fair; Recent Fair; Remote Fair  Judgment: Fair  Insight: Fair   Art therapist  Concentration: Fair  Attention Span: Fair  Recall: [employer] of Knowledge: Fair  Language: Fair   Psychomotor Activity  Psychomotor Activity: Psychomotor Activity: Normal   Assets  Assets: Communication Skills; Desire for Improvement; Social Support; Resilience   Sleep  Sleep: Sleep: Poor     Physical Exam  Physical Exam Eyes:     Conjunctiva/sclera: Conjunctivae normal.  Pulmonary:     Effort: Pulmonary effort is normal.  Musculoskeletal:        General: Normal range of motion.     Cervical back: Normal range of  motion.  Skin:    Comments: Right foot ulcer- healing  Neurological:     Mental Status: He is alert and oriented to person, place, and time.  Psychiatric:        Mood and Affect: Mood normal.        Behavior: Behavior normal.        Thought Content: Thought content normal.    Review of Systems  Constitutional:  Positive for diaphoresis. Negative for chills and fever.  HENT:  Negative for hearing loss, sore throat and tinnitus.   Eyes:  Negative for blurred vision and double vision.  Respiratory:  Negative for cough, shortness of breath and wheezing.   Cardiovascular:  Positive for  palpitations. Negative for chest pain.  Gastrointestinal:  Negative for abdominal pain, constipation, diarrhea, nausea and vomiting.  Genitourinary:  Positive for frequency. Negative for dysuria and urgency.  Musculoskeletal:  Positive for myalgias.  Neurological:  Negative for dizziness and headaches.  Psychiatric/Behavioral:  Positive for substance abuse. Negative for depression, hallucinations and suicidal ideas. The patient is nervous/anxious. The patient does not have insomnia.    Blood pressure 110/89, pulse 75, temperature 98.8 F (37.1 C), temperature source Oral, resp. rate 17, SpO2 98%. There is no height or weight on file to calculate BMI.  Assessment: Joseph Cruz is a 48 y.o. male with a past history of alcohol  use disorder, hypertension, diabetes, panic disorder, and recent foot amputation admitted to [facility] due to suicidal ideation.   On assessment patient continues to require inpatient hospitalization for management of recent alcohol  relapse and disposition planning. Patient presents improved on interview and examination with motivation to overcome his relapse and continue his rehab at in inpatient treatment center. He is interested in returning to [place], [place] where he has a robust care team established. He looks forward to hearing from [facility], but is amenable to [facility]. He has also kept his mind open about [facility] since his support system is largely in the [place] area. His recent medication changes have been beneficial to his anxiety, depression, pain, and nightmares. He looks forward to discharge soon so that he can get back to his treatment for alcohol  use and his foot ulcer. We will continue to monitor his condition, medication efficacy, and determine disposition with social work.  Treatment Plan Summary:   Alcohol  use disorder - No withdrawal symptoms seen or expected based on the patient's reported alcohol  use - Discontinue CIWA with as needed Ativan - most recent score was 0 - Refer patient to residential rehab placement at [facility] (first preference) or [facility] ([facility])   Anxiety and Depression -- Continue Prazosin 1 mg at bedtime for nightmares -- Continue Cymbalta 60 mg daily - Continue Buspar 10 mg TID    Diabetes -- Most recent A1c 8.3 - CBGs between 200-450 between 12/09/23 and 12/12/23 -- Goal CBGs <170 (Patient reported levels between 117-170 prior to admission) - Started on Augmentin in the ED -The patient needs to follow-up with podiatry. - Increase glargine to 45 units daily as reported by patient prior to admission, monitor sliding scale insulin - Continue Jardiance 10 mg daily -- Continue metformin 1000 mg bid   Dispo: pending [facility]   Park Bolk, Medical Student  I personally was present and performed or re-performed the history and medical decision-making activities of this service and have verified that the service and findings are accurately documented in the student's note.  Saratha Cunas, MD, PGY-2

## 2023-12-12 NOTE — ED Notes (Signed)
 Patient is sleeping. Respirations equal and unlabored, skin warm and dry. No change in assessment or acuity. Routine safety checks conducted according to facility protocol. Will continue to monitor for safety.

## 2023-12-12 NOTE — Group Note (Signed)
 Group Topic: Wellness  Group Date: 12/12/2023 Start Time: 1230 End Time: 1400 Facilitators: Arlan Belling, RN  Department: [facility]  Number of Participants: 9  Group Focus: community group Treatment Modality:  Skills Training Interventions utilized were clarification, group exercise, leisure development, patient education, problem solving, and story telling Purpose: enhance coping skills, explore maladaptive thinking, express feelings, express irrational fears, improve communication skills, increase insight, regain self-worth, and reinforce self-care  Name: Joseph Cruz Date of Birth: 07-Oct-1975  MR: [id number]    Level of Participation:  Quality of Participation:  Interactions with others:  Mood/Affect:  Triggers (if applicable):  Cognition:  Progress:  Response:  Plan:   Patients Problems:  Patient Active Problem List   Diagnosis Date Noted   Moderate episode of recurrent major depressive disorder (HCC) 12/09/2023   Suicidal ideation 12/09/2023   Alcohol  use disorder, severe, dependence (HCC) 12/09/2023   Panic disorder without agoraphobia 12/09/2023   MDD (major depressive disorder), recurrent episode (HCC) 12/09/2023

## 2023-12-12 NOTE — Discharge Planning (Signed)
 SW spoke with patient who provided the contact number for [facility]. She stated that they did not have a bed available at this time but could put him on a waiting list. SW made her aware of his alternative plans to return to the healing [facility]. SW faxed clinicals to [email or web address]. SW will follow up with the healing [facility] @ to confirm re-admission anticipated disposition plan for discharge on Thursday 5/1. Will continue to follow.

## 2023-12-12 NOTE — Group Note (Signed)
 Group Topic: Social Support  Group Date: 12/12/2023 Start Time: 1030 End Time: 1110 Facilitators: Dennis Fitting, NT  Department: [facility]  Number of Participants: 5  Group Focus: problem solving and social skills Treatment Modality:  Psychoeducation Interventions utilized were problem solving and support Purpose: explore maladaptive thinking and improve communication skills  Name: Joseph Cruz Date of Birth: Mar 09, 1976  MR: [id number]    Level of Participation: active Quality of Participation: cooperative, initiates communication, and offered feedback Interactions with others: gave feedback Mood/Affect: appropriate and positive Triggers (if applicable): N/A Cognition: coherent/clear Progress: Gaining insight Response: Patient discussed his plan to go to a treatment center in [place] and shared resource with other patients Plan: patient will be encouraged to go to groups   Patients Problems:  Patient Active Problem List   Diagnosis Date Noted   Moderate episode of recurrent major depressive disorder (HCC) 12/09/2023   Suicidal ideation 12/09/2023   Alcohol  use disorder, severe, dependence (HCC) 12/09/2023   Panic disorder without agoraphobia 12/09/2023   MDD (major depressive disorder), recurrent episode (HCC) 12/09/2023

## 2023-12-12 NOTE — ED Notes (Signed)
 Patient remains asleep in bed without issue or complaint.  Patient is on CBG.  He has r foot toe amputation and a wound on the bottom of his foot.  Dressing to be completed.  Will monitor.

## 2023-12-13 DIAGNOSIS — M109 Gout, unspecified: Secondary | ICD-10-CM | POA: Diagnosis present

## 2023-12-13 DIAGNOSIS — F1020 Alcohol dependence, uncomplicated: Secondary | ICD-10-CM | POA: Diagnosis not present

## 2023-12-13 DIAGNOSIS — E119 Type 2 diabetes mellitus without complications: Secondary | ICD-10-CM | POA: Diagnosis not present

## 2023-12-13 DIAGNOSIS — F332 Major depressive disorder, recurrent severe without psychotic features: Secondary | ICD-10-CM | POA: Diagnosis not present

## 2023-12-13 LAB — GLUCOSE, CAPILLARY
Glucose-Capillary: 277 mg/dL — ABNORMAL HIGH (ref 70–99)
Glucose-Capillary: 276 mg/dL — ABNORMAL HIGH (ref 70–99)

## 2023-12-13 MED ORDER — IBUPROFEN 600 MG PO TABS
600.0000 mg | ORAL_TABLET | Freq: Four times a day (QID) | ORAL | Status: DC | PRN
Start: 2023-12-13 — End: 2023-12-14
  Administered 2023-12-13: 600 mg via ORAL
  Filled 2023-12-13: qty 1

## 2023-12-13 MED ORDER — DULOXETINE HCL 60 MG PO CPEP: 60.0000 mg | ORAL_CAPSULE | Freq: Every day | ORAL | 0 refills | Status: AC

## 2023-12-13 MED ORDER — TRAZODONE HCL 50 MG PO TABS: 50.0000 mg | ORAL_TABLET | Freq: Every evening | ORAL | 0 refills | Status: AC | PRN

## 2023-12-13 MED ORDER — AMOXICILLIN-POT CLAVULANATE 875-125 MG PO TABS: 1.0000 | ORAL_TABLET | Freq: Two times a day (BID) | ORAL | Status: AC

## 2023-12-13 MED ORDER — BUSPIRONE HCL 10 MG PO TABS: 10.0000 mg | ORAL_TABLET | Freq: Three times a day (TID) | ORAL | 0 refills | Status: AC

## 2023-12-13 MED ORDER — PRAZOSIN HCL 1 MG PO CAPS: 1.0000 mg | ORAL_CAPSULE | Freq: Every day | ORAL | 0 refills | Status: AC

## 2023-12-13 NOTE — BH Assessment (Signed)
 LCSW received voice message back from [place] in [facility]. Phone call was attempted back several times and left additional voice mails just to confirm patients return tomorrow. Pt will arrange for transport with [facility]. Will continue to follow.

## 2023-12-13 NOTE — Group Note (Signed)
 Group Topic: Change and Accountability  Group Date: 12/13/2023 Start Time: 2000 End Time: 2046 Facilitators: Wendall Halls B  Department: [facility]  Number of Participants: 4  Group Focus: abuse issues, acceptance, activities of daily living skills, check in, and community group Treatment Modality:  Interpersonal Therapy Interventions utilized were leisure development Purpose: enhance coping skills, express feelings, improve communication skills, increase insight, regain self-worth, reinforce self-care, relapse prevention strategies, and trigger / craving management  Name: Joseph Cruz Date of Birth: 12/29/1975  MR: [id number]    Level of Participation: active Quality of Participation: attentive and cooperative Interactions with others: gave feedback Mood/Affect: appropriate and positive Triggers (if applicable): NA Cognition: coherent/clear Progress: Gaining insight Response: NA Plan: patient will be encouraged to keep going to groups.   Patients Problems:  Patient Active Problem List   Diagnosis Date Noted   Gout 12/13/2023   Moderate episode of recurrent major depressive disorder (HCC) 12/09/2023   Suicidal ideation 12/09/2023   Alcohol  use disorder, severe, dependence (HCC) 12/09/2023   Panic disorder without agoraphobia 12/09/2023   MDD (major depressive disorder), recurrent episode (HCC) 12/09/2023

## 2023-12-13 NOTE — ED Notes (Signed)
 Patient is sleeping. Respirations equal and unlabored, skin warm and dry. No change in assessment or acuity. Routine safety checks conducted according to facility protocol. Will continue to monitor for safety.

## 2023-12-13 NOTE — ED Notes (Signed)
 Pt was provided lunch

## 2023-12-13 NOTE — ED Notes (Signed)
 Patient resting with eyes closed in no apparent acute distress. Respirations even and unlabored. Environment secured. Safety checks in place according to facility policy.

## 2023-12-13 NOTE — ED Notes (Signed)
 Dressing on foot changed. Wound appears unchanged from last assessment, no pain or discomfort at site (patient reports lack of sensation from foot to knee). No drainage or odor present.

## 2023-12-13 NOTE — ED Notes (Signed)
 CBG obtained via Dex[employer]. Fingerstick attempted x2 but was unable to result due to flow errors. CBG 399. Patient asymptomatic.

## 2023-12-13 NOTE — Group Note (Signed)
 Group Topic: Relaxation  Group Date: 12/13/2023 Start Time: 1200 End Time: 1230 Facilitators: Chipper Council, NT  Department: [facility]  Number of Participants: 2  Group Focus: check in, clarity of thought, and daily focus Treatment Modality:  Psychoeducation Interventions utilized were exploration, patient education, and support Purpose: increase insight  Name: Joseph Cruz Date of Birth: [birth year]  MR: [id number]    Level of Participation: Pt did not attend group Patients Problems:  Patient Active Problem List   Diagnosis Date Noted   Moderate episode of recurrent major depressive disorder (HCC) 12/09/2023   Suicidal ideation 12/09/2023   Alcohol  use disorder, severe, dependence (HCC) 12/09/2023   Panic disorder without agoraphobia 12/09/2023   MDD (major depressive disorder), recurrent episode (HCC) 12/09/2023

## 2023-12-13 NOTE — ED Notes (Signed)
 PRN Tylenol given due to patient reports of pain in R index finger rating 7/10. Medication administered with no complications. Environment secured, safety checks in place per facility policy.

## 2023-12-13 NOTE — ED Notes (Signed)
 Patient alert & oriented x4. Denies intent to harm self or others when asked. Denies A/VH. Patient reports pain in R index finger rating 7/10, PRN medication given. No acute distress noted. Scheduled and PRN medications administered with no complications. Support and encouragement provided. Routine safety checks conducted per facility protocol. Encouraged patient to notify staff if any thoughts of harm towards self or others arise. Patient verbalizes understanding and agreement.

## 2023-12-13 NOTE — Group Note (Signed)
 Group Topic: Wellness  Group Date: 12/13/2023 Start Time: 1000 End Time: 1010 Facilitators: Pennie Box, RN  Department: [facility]  Number of Participants: 1  Group Focus: check in Treatment Modality:  Individual Therapy Interventions utilized were patient education Purpose: increase insight  Name: Joseph Cruz Date of Birth: [birth year]  MR: [id number]    Level of Participation: active Quality of Participation: attentive Interactions with others: gave feedback Mood/Affect: appropriate Triggers (if applicable): none identified Cognition: insightful Progress: Gaining insight Response: pt reports looking forward for discharge tomorrow and excited to participate in next step of treatment Plan: patient will be encouraged to continue programming on unit  Patients Problems:  Patient Active Problem List   Diagnosis Date Noted   Gout 12/13/2023   Moderate episode of recurrent major depressive disorder (HCC) 12/09/2023   Suicidal ideation 12/09/2023   Alcohol  use disorder, severe, dependence (HCC) 12/09/2023   Panic disorder without agoraphobia 12/09/2023   MDD (major depressive disorder), recurrent episode (HCC) 12/09/2023

## 2023-12-13 NOTE — ED Notes (Addendum)
 Pt c/o finger swelling, R 5th digit and L ring finger, saying 'they feel sprained' and has a pain level of 5/10. MD notified. Pt assessed by MD shortly thereafter. Pt also reports hx of gout. Pt reports that symptoms started yesterday.

## 2023-12-13 NOTE — ED Provider Notes (Signed)
 Behavioral Health Progress Note  Date and Time: 12/13/2023 10:15 AM Name: Joseph Cruz MRN:  [id number]  Subjective:  Joseph Cruz is a 48 y.o. male with a past history of alcohol  use disorder, hypertension, diabetes, panic disorder, and recent foot amputation admitted to [facility] due to suicidal ideation.   Patient was assessed in the milieu in no acute distress. On interview, the patient presented alert and oriented to person, place, time, and situation. He reported his mood as "good" and stated that he had been doing okay since our last conversation. Over the last 24 hours, he reported spending his time attending group sessions. He reported having a good day yesterday . He endorsed improved sleep compared to yesterday with a hearty appetite and adequate hydration. He reported that his bowel movements have been normal. He stated that his energy levels have been at baseline with no new pains. He reported both his anxiety and depression as 4/10 (with 10 being the most severe). He denied any SI, HI, or AVH, and was not responding to internal stimuli during our conversation. He denied any symptoms of alcohol  cravings or withdrawal. He reported that he has been looking forward to discharging tomorrow so that he could return to his residential treatment program. He stated that he looked forward to talking to [facility] later today about returning and requested that the social workers refer him to [facility] since a clinical referral was needed. He also asked if he could be discharged earlier in the day tomorrow so that his family could take him to buy supplies before returning to [facility]. He stated that he hoped to arrange a ride from [place] to [place] around 11 am to reach the [facility] by 2 pm (which is their cutoff). He reported no other concerns during our conversation.  Diagnosis:  Final diagnoses:  Alcohol  use disorder    Total Time spent with patient: 20 minutes  Past  Psychiatric History: See H&P. Past Medical History: See H&P. Family History: See H&P. Family Psychiatric  History: See H&P. Social History: See H&P.  Additional Social History:   Sleep: Good  Appetite:  Good  Current Medications:  Current Facility-Administered Medications  Medication Dose Route Frequency Provider Last Rate Last Admin   acetaminophen (TYLENOL) tablet 650 mg  650 mg Oral Q6H PRN Ajibola, Ene A, NP   650 mg at 12/13/23 0950   alum & mag hydroxide-simeth (MAALOX/MYLANTA) 200-200-20 MG/5ML suspension 30 mL  30 mL Oral Q4H PRN Ajibola, Ene A, NP       amLODipine (NORVASC) tablet 10 mg  10 mg Oral Daily Ajibola, Ene A, NP   10 mg at 12/13/23 0927   amoxicillin-clavulanate (AUGMENTIN) 875-125 MG per tablet 1 tablet  1 tablet Oral BID Ajibola, Ene A, NP   1 tablet at 12/13/23 0927   atorvastatin (LIPITOR) tablet 40 mg  40 mg Oral Daily Marilou Showman, MD   40 mg at 12/13/23 0927   busPIRone (BUSPAR) tablet 10 mg  10 mg Oral TID Marilou Showman, MD   10 mg at 12/13/23 0927   haloperidol (HALDOL) tablet 5 mg  5 mg Oral TID PRN Ajibola, Ene A, NP       And   diphenhydrAMINE  (BENADRYL ) capsule 50 mg  50 mg Oral TID PRN Ajibola, Ene A, NP   50 mg at 12/12/23 0006   haloperidol lactate (HALDOL) injection 5 mg  5 mg Intramuscular TID PRN Ajibola, Ene A, NP       And  diphenhydrAMINE  (BENADRYL ) injection 50 mg  50 mg Intramuscular TID PRN Ajibola, Ene A, NP       And   LORazepam  (ATIVAN ) injection 2 mg  2 mg Intramuscular TID PRN Ajibola, Ene A, NP       haloperidol lactate (HALDOL) injection 10 mg  10 mg Intramuscular TID PRN Ajibola, Ene A, NP       And   diphenhydrAMINE  (BENADRYL ) injection 50 mg  50 mg Intramuscular TID PRN Ajibola, Ene A, NP       And   LORazepam  (ATIVAN ) injection 2 mg  2 mg Intramuscular TID PRN Ajibola, Ene A, NP       DULoxetine (CYMBALTA) DR capsule 60 mg  60 mg Oral Daily Wade Sigala B, MD   60 mg at 12/13/23 0927   empagliflozin (JARDIANCE) tablet  10 mg  10 mg Oral Q breakfast Marilou Showman, MD   10 mg at 12/13/23 0828   gabapentin (NEURONTIN) capsule 600 mg  600 mg Oral TID Marilou Showman, MD   600 mg at 12/13/23 0930   insulin aspart (novoLOG) injection 0-15 Units  0-15 Units Subcutaneous TID WC Marilou Showman, MD   8 Units at 12/13/23 1610   insulin glargine-yfgn [facility]) injection 45 Units  45 Units Subcutaneous Daily Annalynne Ibanez B, MD   45 Units at 12/13/23 9604   lipase/protease/amylase (CREON) capsule 12,000 Units  12,000 Units Oral TID Marilou Showman, MD   12,000 Units at 12/13/23 5409   magnesium hydroxide (MILK OF MAGNESIA) suspension 30 mL  30 mL Oral Daily PRN Ajibola, Ene A, NP       metFORMIN (GLUCOPHAGE) tablet 1,000 mg  1,000 mg Oral BID WC Marilou Showman, MD   1,000 mg at 12/13/23 0828   multivitamin with minerals tablet 1 tablet  1 tablet Oral Daily Ajibola, Ene A, NP   1 tablet at 12/13/23 0927   prazosin (MINIPRESS) capsule 1 mg  1 mg Oral QHS Joaquina Nissen B, MD   1 mg at 12/12/23 2105   thiamine (VITAMIN B1) tablet 100 mg  100 mg Oral Daily Ajibola, Ene A, NP   100 mg at 12/13/23 8119   traZODone  (DESYREL ) tablet 50 mg  50 mg Oral QHS PRN Ajibola, Ene A, NP   50 mg at 12/12/23 2105   Current Outpatient Medications  Medication Sig Dispense Refill   amLODipine (NORVASC) 10 MG tablet Take 10 mg by mouth daily.     amoxicillin-clavulanate (AUGMENTIN) 875-125 MG tablet Take 1 tablet by mouth 2 (two) times daily for 10 days. 20 tablet 0   atorvastatin (LIPITOR) 40 MG tablet Take 40 mg by mouth daily.     busPIRone (BUSPAR) 10 MG tablet Take 10 mg by mouth 3 (three) times daily.     carvedilol (COREG) 6.25 MG tablet Take 6.25 mg by mouth 2 (two) times daily with a meal.     DULoxetine (CYMBALTA) 30 MG capsule Take 30 mg by mouth daily.     empagliflozin (JARDIANCE) 10 MG TABS tablet Take 10 mg by mouth daily with breakfast.     gabapentin (NEURONTIN) 600 MG tablet Take 600 mg by mouth 3 (three) times daily.      insulin aspart (NOVOLOG) 100 UNIT/ML injection Inject 0-9 Units into the skin 3 (three) times daily with meals. 10 mL 11   insulin glargine (LANTUS SOLOSTAR) 100 UNIT/ML Solostar Pen Inject 22 Units into the skin daily.     linagliptin (TRADJENTA) 5 MG TABS tablet Take 5  mg by mouth daily.     lisinopril (ZESTRIL) 10 MG tablet Take 10 mg by mouth daily.     metFORMIN (GLUCOPHAGE) 1000 MG tablet Take 1 tablet (1,000 mg total) by mouth 2 (two) times daily with a meal. 60 tablet 0   naloxone (NARCAN) nasal spray 4 mg/0.1 mL Place 1 spray into the nose once.     Pancrelipase, Lip-Prot-Amyl, 3000-9500 units CPEP Take 3 capsules by mouth 3 (three) times daily.      Labs  Lab Results:  Admission on 12/09/2023  Component Date Value Ref Range Status   Glucose-Capillary 12/10/2023 271 (H)  70 - 99 mg/dL Final   Glucose reference range applies only to samples taken after fasting for at least 8 hours.   Glucose-Capillary 12/10/2023 283 (H)  70 - 99 mg/dL Final   Glucose reference range applies only to samples taken after fasting for at least 8 hours.   Glucose-Capillary 12/10/2023 439 (H)  70 - 99 mg/dL Final   Glucose reference range applies only to samples taken after fasting for at least 8 hours.   Glucose-Capillary 12/11/2023 259 (H)  70 - 99 mg/dL Final   Glucose reference range applies only to samples taken after fasting for at least 8 hours.   Glucose-Capillary 12/11/2023 290 (H)  70 - 99 mg/dL Final   Glucose reference range applies only to samples taken after fasting for at least 8 hours.   Glucose-Capillary 12/11/2023 231 (H)  70 - 99 mg/dL Final   Glucose reference range applies only to samples taken after fasting for at least 8 hours.   Glucose-Capillary 12/12/2023 322 (H)  70 - 99 mg/dL Final   Glucose reference range applies only to samples taken after fasting for at least 8 hours.   Glucose-Capillary 12/12/2023 359 (H)  70 - 99 mg/dL Final   Glucose reference range applies only to  samples taken after fasting for at least 8 hours.   Glucose-Capillary 12/12/2023 146 (H)  70 - 99 mg/dL Final   Glucose reference range applies only to samples taken after fasting for at least 8 hours.   Glucose-Capillary 12/12/2023 375 (H)  70 - 99 mg/dL Final   Glucose reference range applies only to samples taken after fasting for at least 8 hours.   Glucose-Capillary 12/13/2023 277 (H)  70 - 99 mg/dL Final   Glucose reference range applies only to samples taken after fasting for at least 8 hours.  Admission on 12/09/2023, Discharged on 12/09/2023  Component Date Value Ref Range Status   SARS Coronavirus 2 by RT PCR 12/09/2023 NEGATIVE  NEGATIVE Final   Performed at [facility], [address], [place], [place] [address]   WBC 12/09/2023 8.6  4.0 - 10.5 K/uL Final   RBC 12/09/2023 4.91  4.22 - 5.81 MIL/uL Final   Hemoglobin 12/09/2023 14.1  13.0 - 17.0 g/dL Final   HCT 60/45/4098 41.8  39.0 - 52.0 % Final   MCV 12/09/2023 85.1  80.0 - 100.0 fL Final   MCH 12/09/2023 28.7  26.0 - 34.0 pg Final   MCHC 12/09/2023 33.7  30.0 - 36.0 g/dL Final   RDW 11/91/4782 12.6  11.5 - 15.5 % Final   Platelets 12/09/2023 345  150 - 400 K/uL Final   nRBC 12/09/2023 0.0  0.0 - 0.2 % Final   Neutrophils Relative % 12/09/2023 73  % Final   Neutro Abs 12/09/2023 6.3  1.7 - 7.7 K/uL Final   Lymphocytes Relative 12/09/2023 19  %  Final   Lymphs Abs 12/09/2023 1.6  0.7 - 4.0 K/uL Final   Monocytes Relative 12/09/2023 6  % Final   Monocytes Absolute 12/09/2023 0.5  0.1 - 1.0 K/uL Final   Eosinophils Relative 12/09/2023 1  % Final   Eosinophils Absolute 12/09/2023 0.1  0.0 - 0.5 K/uL Final   Basophils Relative 12/09/2023 1  % Final   Basophils Absolute 12/09/2023 0.1  0.0 - 0.1 K/uL Final   Immature Granulocytes 12/09/2023 0  % Final   Abs Immature Granulocytes 12/09/2023 0.03  0.00 - 0.07 K/uL Final   Performed at [facility], [address] [place], [place] [address]   Sodium 12/09/2023 136   135 - 145 mmol/L Final   Potassium 12/09/2023 3.7  3.5 - 5.1 mmol/L Final   Chloride 12/09/2023 97 (L)  98 - 111 mmol/L Final   CO2 12/09/2023 27  22 - 32 mmol/L Final   Glucose, Bld 12/09/2023 193 (H)  70 - 99 mg/dL Final   Glucose reference range applies only to samples taken after fasting for at least 8 hours.   BUN 12/09/2023 14  6 - 20 mg/dL Final   Creatinine, Ser 12/09/2023 1.01  0.61 - 1.24 mg/dL Final   Calcium 60/45/4098 10.1  8.9 - 10.3 mg/dL Final   Total Protein 11/91/4782 8.4 (H)  6.5 - 8.1 g/dL Final   Albumin 95/62/1308 4.2  3.5 - 5.0 g/dL Final   AST 65/78/4696 35  15 - 41 U/L Final   ALT 12/09/2023 31  0 - 44 U/L Final   Alkaline Phosphatase 12/09/2023 126  38 - 126 U/L Final   Total Bilirubin 12/09/2023 0.7  0.0 - 1.2 mg/dL Final   GFR, Estimated 12/09/2023 >60  >60 mL/min Final   Comment: (NOTE) Calculated using the CKD-EPI Creatinine Equation (2021)    Anion gap 12/09/2023 12  5 - 15 Final   Performed at [facility], [address], [place], [place] [address]   Hgb A1c MFr Bld 12/09/2023 8.3 (H)  4.8 - 5.6 % Final   Comment: (NOTE) Pre diabetes:          5.7%-6.4%  Diabetes:              >6.4%  Glycemic control for   <7.0% adults with diabetes    Mean Plasma Glucose 12/09/2023 191.51  mg/dL Final   Performed at [facility], [address] [place], [place] [address]   Magnesium 12/09/2023 1.8  1.7 - 2.4 mg/dL Final   Performed at [facility], [address], [place], [place] [address]   Alcohol , Ethyl (B) 12/09/2023 <15  <15 mg/dL Final   Comment: Please note change in reference range. (NOTE) For medical purposes only. Performed at [facility], [address] [place], [place] [address]    Cholesterol 12/09/2023 122  0 - 200 mg/dL Final   Triglycerides 64/40/3474 65  <150 mg/dL Final   HDL 25/95/6387 49  >40 mg/dL Final   Total CHOL/HDL Ratio 12/09/2023 2.5  RATIO Final   VLDL 12/09/2023 13  0 - 40 mg/dL Final   LDL  Cholesterol 12/09/2023 60  0 - 99 mg/dL Final   Comment:        Total Cholesterol/HDL:CHD Risk Coronary Heart Disease Risk Table                     Men   Women  1/2 Average Risk  3.4   3.3  Average Risk       5.0   4.4  2 X Average Risk   9.6   7.1  3 X Average Risk  23.4   11.0        Use the calculated Patient Ratio above and the CHD Risk Table to determine the patient's CHD Risk.        ATP III CLASSIFICATION (LDL):  <100     mg/dL   Optimal  098-119  mg/dL   Near or Above                    Optimal  130-159  mg/dL   Borderline  147-829  mg/dL   High  >562     mg/dL   Very High Performed at [facility], [address] [place], [place] [address]    TSH 12/09/2023 2.109  0.350 - 4.500 uIU/mL Final   Comment: Performed by a 3rd Generation assay with a functional sensitivity of <=0.01 uIU/mL. Performed at [facility], [address], [place], [place] [address]    Color, Urine 12/09/2023 YELLOW  YELLOW Final   APPearance 12/09/2023 CLEAR  CLEAR Final   Specific Gravity, Urine 12/09/2023 1.022  1.005 - 1.030 Final   pH 12/09/2023 7.0  5.0 - 8.0 Final   Glucose, UA 12/09/2023 >=500 (A)  NEGATIVE mg/dL Final   Hgb urine dipstick 12/09/2023 NEGATIVE  NEGATIVE Final   Bilirubin Urine 12/09/2023 NEGATIVE  NEGATIVE Final   Ketones, ur 12/09/2023 NEGATIVE  NEGATIVE mg/dL Final   Protein, ur 96/29/5284 NEGATIVE  NEGATIVE mg/dL Final   Nitrite 13/24/4010 NEGATIVE  NEGATIVE Final   Leukocytes,Ua 12/09/2023 NEGATIVE  NEGATIVE Final   RBC / HPF 12/09/2023 0-5  0 - 5 RBC/hpf Final   WBC, UA 12/09/2023 0-5  0 - 5 WBC/hpf Final   Bacteria, UA 12/09/2023 NONE SEEN  NONE SEEN Final   Squamous Epithelial / HPF 12/09/2023 0-5  0 - 5 /HPF Final   Performed at [facility], [address], [place], [place] [address]   POC Amphetamine UR 12/09/2023 None Detected  NONE DETECTED (Cut Off Level 1000 ng/mL) Final   POC Secobarbital (BAR) 12/09/2023 None Detected  NONE DETECTED  (Cut Off Level 300 ng/mL) Final   POC Buprenorphine (BUP) 12/09/2023 None Detected  NONE DETECTED (Cut Off Level 10 ng/mL) Final   POC Oxazepam (BZO) 12/09/2023 None Detected  NONE DETECTED (Cut Off Level 300 ng/mL) Final   POC Cocaine UR 12/09/2023 None Detected  NONE DETECTED (Cut Off Level 300 ng/mL) Final   POC Methamphetamine UR 12/09/2023 None Detected  NONE DETECTED (Cut Off Level 1000 ng/mL) Final   POC Morphine 12/09/2023 None Detected  NONE DETECTED (Cut Off Level 300 ng/mL) Final   POC Methadone UR 12/09/2023 None Detected  NONE DETECTED (Cut Off Level 300 ng/mL) Final   POC Oxycodone UR 12/09/2023 None Detected  NONE DETECTED (Cut Off Level 100 ng/mL) Final   POC Marijuana UR 12/09/2023 None Detected  NONE DETECTED (Cut Off Level 50 ng/mL) Final   Sed Rate 12/09/2023 15  0 - 16 mm/hr Final   Performed at [facility], [address], [place], [place] [address]   CRP 12/09/2023 0.8  <1.0 mg/dL Final   Performed at [facility], [address], [place], [place] [address]   Glucose-Capillary 12/09/2023 195 (H)  70 - 99 mg/dL Final   Glucose reference range  applies only to samples taken after fasting for at least 8 hours.    Blood Alcohol  level:  Lab Results  Component Value Date   [facility] <15 12/09/2023   ETH <10 12/16/2020    Metabolic Disorder Labs: Lab Results  Component Value Date   HGBA1C 8.3 (H) 12/09/2023   MPG 191.51 12/09/2023   No results found for: "PROLACTIN" Lab Results  Component Value Date   CHOL 122 12/09/2023   TRIG 65 12/09/2023   HDL 49 12/09/2023   CHOLHDL 2.5 12/09/2023   VLDL 13 12/09/2023   LDLCALC 60 12/09/2023    Therapeutic Lab Levels: No results found for: "LITHIUM" No results found for: "VALPROATE" No results found for: "CBMZ"  Physical Findings   PHQ2-9    Flowsheet Row ED from 12/09/2023 in [facility] Most recent reading at 12/09/2023  9:30 PM ED from 12/09/2023 in [facility] Most recent reading at 12/09/2023 11:33 AM Counselor from 11/18/2020 in [facility] Most recent reading at 11/18/2020  2:24 PM  PHQ-2 Total Score 2 2 0  PHQ-9 Total Score 17 15 --      Flowsheet Row ED from 12/09/2023 in [facility] Most recent reading at 12/09/2023 11:34 PM ED from 12/09/2023 in [facility] Emergency Department at [facility] Most recent reading at 12/09/2023  3:31 PM ED from 12/09/2023 in [facility] Most recent reading at 12/09/2023 11:33 AM  C-SSRS RISK CATEGORY Low Risk No Risk High Risk        Musculoskeletal  Strength & Muscle Tone: within normal limits Gait & Station: normal Patient leans: N/A  Psychiatric Specialty Exam  Presentation  General Appearance:  Appropriate for Environment; Well Groomed  Eye Contact: Good  Speech: Clear and Coherent; Normal Rate  Speech Volume: Normal  Handedness:No data recorded  Mood and Affect  Mood: Euthymic ("Good")  Affect: Congruent; Appropriate   Thought Process  Thought Processes: Coherent; Goal Directed; Linear  Descriptions of Associations:Intact  Orientation:Full (Time, Place and Person)  Thought Content:Logical  Diagnosis of Schizophrenia or Schizoaffective disorder in past: No  Hallucinations:Hallucinations: None  Ideas of Reference:None  Suicidal Thoughts:Suicidal Thoughts: No  Homicidal Thoughts:Homicidal Thoughts: No   Sensorium  Memory: Immediate Fair; Recent Fair; Remote Fair  Judgment: Good  Insight: Good   Executive Functions  Concentration: Fair  Attention Span: Fair  Recall: Fair  Fund of Knowledge: Fair  Language: Fair   Psychomotor Activity  Psychomotor Activity: Psychomotor Activity: Normal   Assets  Assets: Communication Skills; Desire for Improvement; Social Support; Resilience; Transportation   Sleep  Sleep: Sleep: Good  Physical Exam   Physical Exam Eyes:     Conjunctiva/sclera: Conjunctivae normal.  Pulmonary:     Effort: Pulmonary effort is normal.  Musculoskeletal:        General: Normal range of motion.     Cervical back: Normal range of motion.  Skin:    Comments: Right foot ulcer- healing  Neurological:     General: No focal deficit present.     Mental Status: He is alert and oriented to person, place, and time.  Psychiatric:        Mood and Affect: Mood normal.        Behavior: Behavior normal.        Thought Content: Thought content normal.        Judgment: Judgment normal.    Review of Systems  Constitutional:  Negative  for chills, diaphoresis and fever.  HENT:  Negative for hearing loss, sore throat and tinnitus.   Eyes:  Negative for blurred vision and double vision.  Respiratory:  Negative for cough, shortness of breath and wheezing.   Cardiovascular:  Negative for chest pain and palpitations.  Gastrointestinal:  Negative for abdominal pain, constipation, diarrhea, nausea and vomiting.  Genitourinary:  Negative for dysuria, frequency and urgency.  Musculoskeletal:  Negative for myalgias.  Neurological:  Negative for dizziness and headaches.  Psychiatric/Behavioral:  Positive for depression. Negative for hallucinations, substance abuse and suicidal ideas. The patient is nervous/anxious. The patient does not have insomnia.    Blood pressure 118/82, pulse 83, temperature 97.6 F (36.4 C), temperature source Oral, resp. rate 17, SpO2 98%. There is no height or weight on file to calculate BMI.  Assessment: Joseph Cruz is a 48 y.o. male with a past history of alcohol  use disorder, hypertension, diabetes, panic disorder, and recent foot amputation admitted to [facility] due to suicidal ideation.   On assessment patient continues to require inpatient hospitalization for disposition planning. Patient presents improved and stable on interview since admission. He is motivated to overcoming his recent relapse and  return to this residential program for further treatment. He is still hopeful about a referral to [facility], but is planning on returning to [facility] after discharge tomorrow. He is going to speak to the [facility] later today to confirm his plans. His recent medication changes have been beneficial to his anxiety, depression, pain, and anxiety. He looks forward to returning to [place], [place] where he has a robust care team. We will continue to monitor his status, medication efficacy, and collaborate with social work to confirm his disposition with anticipated discharge early tomorrow morning.  Treatment Plan Summary:   Alcohol  use disorder - No withdrawal symptoms seen or expected based on the patient's reported alcohol  use - Refer patient to residential rehab placement at [facility] (first preference) or [facility] (second preference)   Anxiety and Depression -- Continue Prazosin 1 mg at bedtime for nightmares -- Continue Cymbalta 60 mg daily - Continue Buspar 10 mg TID -- Continue Gabapentin 600 mg TID    Diabetes -- Most recent A1c 8.3 - CBGs between 200-450 between 12/09/23 and 12/13/23 -- Goal CBGs <170 (Patient reported levels between 117-170 prior to admission) - Started on Augmentin in the ED -The patient needs to follow-up with podiatry. - Continue glargine 45 units daily as reported by patient prior to admission, monitor sliding scale insulin - Continue Jardiance 10 mg daily -- Continue metformin 1000 mg bid   Dispo: Patient planning on returning to [facility], but would like clinical referral to [facility]. Anticipated discharge tomorrow 05/01   Park Bolk, Medical Student  I personally was present and performed or re-performed the history and medical decision-making activities of this service and have verified that the service and findings are accurately documented in the student's note.  Saratha Cunas, MD, PGY-2

## 2023-12-13 NOTE — ED Notes (Signed)
 CBG obtained from [facility] - 375

## 2023-12-13 NOTE — ED Notes (Signed)
 Pt was provided dinner.

## 2023-12-13 NOTE — Discharge Instructions (Signed)

## 2023-12-14 DIAGNOSIS — I10 Essential (primary) hypertension: Secondary | ICD-10-CM | POA: Insufficient documentation

## 2023-12-14 DIAGNOSIS — E119 Type 2 diabetes mellitus without complications: Secondary | ICD-10-CM

## 2023-12-14 DIAGNOSIS — E1140 Type 2 diabetes mellitus with diabetic neuropathy, unspecified: Secondary | ICD-10-CM | POA: Insufficient documentation

## 2023-12-14 DIAGNOSIS — F332 Major depressive disorder, recurrent severe without psychotic features: Secondary | ICD-10-CM | POA: Diagnosis not present

## 2023-12-14 DIAGNOSIS — K219 Gastro-esophageal reflux disease without esophagitis: Secondary | ICD-10-CM | POA: Insufficient documentation

## 2023-12-14 DIAGNOSIS — S98111A Complete traumatic amputation of right great toe, initial encounter: Secondary | ICD-10-CM | POA: Insufficient documentation

## 2023-12-14 DIAGNOSIS — I426 Alcoholic cardiomyopathy: Secondary | ICD-10-CM | POA: Insufficient documentation

## 2023-12-14 DIAGNOSIS — K852 Alcohol induced acute pancreatitis without necrosis or infection: Secondary | ICD-10-CM | POA: Insufficient documentation

## 2023-12-14 DIAGNOSIS — F1020 Alcohol dependence, uncomplicated: Secondary | ICD-10-CM | POA: Diagnosis not present

## 2023-12-14 LAB — GLUCOSE, CAPILLARY: Glucose-Capillary: 313 mg/dL — ABNORMAL HIGH (ref 70–99)

## 2023-12-14 NOTE — ED Notes (Signed)
Patient is sleeping. Respirations equal and unlabored, skin warm and dry, NAD. No change in assessment or acuity. Routine safety checks conducted according to facility protocol. Will continue to monitor for safety.   

## 2023-12-14 NOTE — ED Provider Notes (Signed)
 FBC/OBS ASAP Discharge Summary  Date and Time: 12/14/2023 8:13 AM  Name: Joseph Cruz  MRN:  [id number]   Discharge Diagnoses:  Final diagnoses:  Alcohol  use disorder    Subjective: per HPI 4/27: HPI:  The patient is a 48 year old male with a history of alcohol  use disorder.  Last psychiatric hospitalization in 2022 for alcohol  abuse.  The patient presented to the [facility] on the present occasion reporting suicidal thoughts after relapse.  He is currently admitted to the facility based crisis for further treatment.  Pertinent medical comorbidities include poorly controlled diabetes.   The patient states that he was at a residential treatment facility in [place]  He reports being kicked out of the program after several months because of inappropriate conversations with a male peer.  The patient states that he is able to go back to the program on Thursday.  He desires a door-to-door transfer.  I told him that this will be left to the discretion of the weekday team.   The patient reports some social support from his parents, though he says his father told him to commit suicide after he relapsed.  The patient denies experiencing suicidal thoughts presently.  He reports significant depressed mood.  He reports consuming alcohol  only on 1 occasion over the past few months (the one day he relapsed, which he says was 4/25).  As would be expected, he denies experiencing any withdrawal symptoms.  Stay Summary: During the course of patient's hospitalization, the 15-minute checks were adequate to ensure patient's safety. Patient did not exhibit erratic or aggressive behavior and was compliant with scheduled medication. Patient was recommended for outpatient psychiatry.  At the time of discharge patient is not reporting any acute suicidal/homicidal ideations/AVH, delusional thoughts or paranoia. Patient did not appear to be responding to any internal  stimuli. Patient feels more confident about self-care & in managing their mental health problems. Patient currently denies any new issues or concerns. Education and supportive counseling provided throughout patient's hospital stay & upon discharge. Patient was safely detoxed from all alcohol  and recreational substance use and was not experiencing any withdrawal on day of discharge.    Today upon discharge evaluation, the patient gives a mood of "good". Patient denies any specific concerns. Patient slept well, appetite good, regular bowel movements. Patient denies any new physical complaints. Patient feels that the medications have been helpful & is in agreement to continue current treatment regimen as recommended. Patient was able to engage in safety planning including plan to return to [facility] or contact emergency services if patient feels unable to maintain their own safety or the safety of others. Patient had no further questions, comments, or concerns. Patient left [facility] with all personal belongings in no apparent distress. Transportation per safe transport to home was arranged for patient.  Total Time spent with patient: 20 minutes  Past Psychiatric History: GAD, substance use disorder Past Medical History: DM2 now insulin  dependent. Trial of semaglutide for DM2 resulted in extensive weight loss 2 years ago. GLP1 discontinued but patient continued to lose weight possibly secondary to pancreatitis and other chronic health conditions. 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 Enjoys fishing.  Tobacco Cessation:  A prescription for an FDA-approved tobacco cessation medication was offered at  discharge and the patient refused  Current Medications:  Current Facility-Administered Medications  Medication Dose Route Frequency Provider Last Rate Last Admin   acetaminophen  (TYLENOL ) tablet 650 mg  650 mg Oral Q6H PRN Ajibola, Ene A, NP   650 mg at 12/13/23 1619   alum & mag hydroxide-simeth (MAALOX/MYLANTA) 200-200-20 MG/5ML suspension 30 mL  30 mL Oral Q4H PRN Ajibola, Ene A, NP       amLODipine  (NORVASC ) tablet 10 mg  10 mg Oral Daily Ajibola, Ene A, NP   10 mg at 12/13/23 5366   amoxicillin -clavulanate (AUGMENTIN ) 875-125 MG per tablet 1 tablet  1 tablet Oral BID Ajibola, Ene A, NP   1 tablet at 12/13/23 2110   atorvastatin  (LIPITOR) tablet 40 mg  40 mg Oral Daily Marilou Showman, MD   40 mg at 12/13/23 4403   busPIRone  (BUSPAR ) tablet 10 mg  10 mg Oral TID Marilou Showman, MD   10 mg at 12/13/23 2110   haloperidol  (HALDOL ) tablet 5 mg  5 mg Oral TID PRN Ajibola, Ene A, NP       And   diphenhydrAMINE  (BENADRYL ) capsule 50 mg  50 mg Oral TID PRN Ajibola, Ene A, NP   50 mg at 12/12/23 0006   haloperidol  lactate (HALDOL ) injection 5 mg  5 mg Intramuscular TID PRN Ajibola, Ene A, NP       And   diphenhydrAMINE  (BENADRYL ) injection 50 mg  50 mg Intramuscular TID PRN Ajibola, Ene A, NP       And   LORazepam  (ATIVAN ) injection 2 mg  2 mg Intramuscular TID PRN Ajibola, Ene A, NP       haloperidol  lactate (HALDOL ) injection 10 mg  10 mg Intramuscular TID PRN Ajibola, Ene A, NP       And   diphenhydrAMINE  (BENADRYL ) injection 50 mg  50 mg Intramuscular TID PRN Ajibola, Ene A, NP       And   LORazepam  (ATIVAN ) injection 2 mg  2 mg Intramuscular TID PRN Ajibola, Ene A, NP       DULoxetine  (CYMBALTA ) DR capsule 60 mg  60 mg Oral Daily Hoang, Daniela B, MD   60 mg at 12/13/23 4742   empagliflozin  (JARDIANCE ) tablet 10 mg  10 mg Oral Q breakfast Marilou Showman, MD   10 mg at 12/13/23 5956   gabapentin  (NEURONTIN ) capsule 600 mg  600 mg Oral TID Marilou Showman, MD   600 mg at 12/13/23 2110    ibuprofen  (ADVIL ) tablet 600 mg  600 mg Oral Q6H PRN Floyce Hutching, MD   600 mg at 12/13/23 1622   insulin  aspart (novoLOG ) injection 0-15 Units  0-15 Units Subcutaneous TID WC Marilou Showman, MD   15 Units at 12/13/23 1658   insulin  glargine-yfgn (SEMGLEE ) injection 45 Units  45 Units Subcutaneous Daily Hoang, Daniela B, MD   45 Units at 12/13/23 3875   lipase/protease/amylase (CREON ) capsule 12,000 Units  12,000 Units Oral TID Marilou Showman, MD   12,000 Units at 12/13/23 2109   magnesium  hydroxide (MILK OF MAGNESIA) suspension 30 mL  30 mL Oral Daily PRN Ajibola, Ene A, NP       metFORMIN  (GLUCOPHAGE ) tablet 1,000 mg  1,000 mg Oral BID WC Marilou Showman, MD   1,000 mg at 12/13/23 1658   multivitamin with minerals tablet 1 tablet  1 tablet Oral Daily Ajibola, Ene A, NP   1 tablet  at 12/13/23 2725   prazosin  (MINIPRESS ) capsule 1 mg  1 mg Oral QHS Hoang, Daniela B, MD   1 mg at 12/13/23 2111   thiamine  (VITAMIN B1) tablet 100 mg  100 mg Oral Daily Ajibola, Ene A, NP   100 mg at 12/13/23 3664   traZODone  (DESYREL ) tablet 50 mg  50 mg Oral QHS PRN Ajibola, Ene A, NP   50 mg at 12/13/23 2111   Current Outpatient Medications  Medication Sig Dispense Refill   amLODipine  (NORVASC ) 10 MG tablet Take 10 mg by mouth daily.     amoxicillin -clavulanate (AUGMENTIN ) 875-125 MG tablet Take 1 tablet by mouth 2 (two) times daily for 10 days.     atorvastatin  (LIPITOR) 40 MG tablet Take 40 mg by mouth daily.     busPIRone  (BUSPAR ) 10 MG tablet Take 1 tablet (10 mg total) by mouth 3 (three) times daily. 90 tablet 0   DULoxetine  (CYMBALTA ) 60 MG capsule Take 1 capsule (60 mg total) by mouth daily. 30 capsule 0   empagliflozin  (JARDIANCE ) 10 MG TABS tablet Take 10 mg by mouth daily with breakfast.     gabapentin  (NEURONTIN ) 600 MG tablet Take 600 mg by mouth 3 (three) times daily.     insulin  aspart (NOVOLOG ) 100 UNIT/ML injection Inject 0-9 Units into the skin 3 (three) times daily with meals. 10 mL 11    insulin  glargine (LANTUS  SOLOSTAR) 100 UNIT/ML Solostar Pen Inject 22 Units into the skin daily.     linagliptin (TRADJENTA) 5 MG TABS tablet Take 5 mg by mouth daily.     metFORMIN  (GLUCOPHAGE ) 1000 MG tablet Take 1 tablet (1,000 mg total) by mouth 2 (two) times daily with a meal. 60 tablet 0   Pancrelipase , Lip-Prot-Amyl, 3000-9500 units CPEP Take 3 capsules by mouth 3 (three) times daily.     prazosin  (MINIPRESS ) 1 MG capsule Take 1 capsule (1 mg total) by mouth at bedtime. 30 capsule 0   traZODone  (DESYREL ) 50 MG tablet Take 1 tablet (50 mg total) by mouth at bedtime as needed for sleep. 30 tablet 0    PTA Medications:  Facility Ordered Medications  Medication   [COMPLETED] amLODipine  (NORVASC ) tablet 10 mg   [COMPLETED] amoxicillin -clavulanate (AUGMENTIN ) 875-125 MG per tablet 1 tablet   acetaminophen  (TYLENOL ) tablet 650 mg   alum & mag hydroxide-simeth (MAALOX/MYLANTA) 200-200-20 MG/5ML suspension 30 mL   magnesium  hydroxide (MILK OF MAGNESIA) suspension 30 mL   haloperidol  (HALDOL ) tablet 5 mg   And   diphenhydrAMINE  (BENADRYL ) capsule 50 mg   haloperidol  lactate (HALDOL ) injection 5 mg   And   diphenhydrAMINE  (BENADRYL ) injection 50 mg   And   LORazepam  (ATIVAN ) injection 2 mg   haloperidol  lactate (HALDOL ) injection 10 mg   And   diphenhydrAMINE  (BENADRYL ) injection 50 mg   And   LORazepam  (ATIVAN ) injection 2 mg   traZODone  (DESYREL ) tablet 50 mg   thiamine  (VITAMIN B1) tablet 100 mg   multivitamin with minerals tablet 1 tablet   [EXPIRED] LORazepam  (ATIVAN ) tablet 1 mg   [EXPIRED] hydrOXYzine  (ATARAX ) tablet 25 mg   [EXPIRED] loperamide  (IMODIUM ) capsule 2-4 mg   [EXPIRED] ondansetron  (ZOFRAN -ODT) disintegrating tablet 4 mg   amoxicillin -clavulanate (AUGMENTIN ) 875-125 MG per tablet 1 tablet   amLODipine  (NORVASC ) tablet 10 mg   atorvastatin  (LIPITOR) tablet 40 mg   busPIRone  (BUSPAR ) tablet 10 mg   empagliflozin  (JARDIANCE ) tablet 10 mg   gabapentin   (NEURONTIN ) capsule 600 mg   metFORMIN  (GLUCOPHAGE ) tablet 1,000 mg  lipase/protease/amylase (CREON ) capsule 12,000 Units   insulin  aspart (novoLOG ) injection 0-15 Units   [COMPLETED] insulin  aspart (novoLOG ) injection 5 Units   DULoxetine  (CYMBALTA ) DR capsule 60 mg   prazosin  (MINIPRESS ) capsule 1 mg   insulin  glargine-yfgn (SEMGLEE ) injection 45 Units   ibuprofen  (ADVIL ) tablet 600 mg   PTA Medications  Medication Sig   atorvastatin  (LIPITOR) 40 MG tablet Take 40 mg by mouth daily.   empagliflozin  (JARDIANCE ) 10 MG TABS tablet Take 10 mg by mouth daily with breakfast.   insulin  glargine (LANTUS  SOLOSTAR) 100 UNIT/ML Solostar Pen Inject 22 Units into the skin daily.   amLODipine  (NORVASC ) 10 MG tablet Take 10 mg by mouth daily.   Pancrelipase , Lip-Prot-Amyl, 3000-9500 units CPEP Take 3 capsules by mouth 3 (three) times daily.   linagliptin (TRADJENTA) 5 MG TABS tablet Take 5 mg by mouth daily.   amoxicillin -clavulanate (AUGMENTIN ) 875-125 MG tablet Take 1 tablet by mouth 2 (two) times daily for 10 days.   prazosin  (MINIPRESS ) 1 MG capsule Take 1 capsule (1 mg total) by mouth at bedtime.   busPIRone  (BUSPAR ) 10 MG tablet Take 1 tablet (10 mg total) by mouth 3 (three) times daily.   DULoxetine  (CYMBALTA ) 60 MG capsule Take 1 capsule (60 mg total) by mouth daily.   traZODone  (DESYREL ) 50 MG tablet Take 1 tablet (50 mg total) by mouth at bedtime as needed for sleep.       12/09/2023    9:30 PM 12/09/2023   11:33 AM 11/18/2020    2:24 PM  Depression screen PHQ 2/9  Decreased Interest 1 1 0  Down, Depressed, Hopeless 1 1 0  PHQ - 2 Score 2 2 0  Altered sleeping 2 2   Tired, decreased energy 3 2   Change in appetite 2 1   Feeling bad or failure about yourself  2 2   Trouble concentrating 2 2   Moving slowly or fidgety/restless 2 2   Suicidal thoughts 2 2   PHQ-9 Score 17 15   Difficult doing work/chores Very difficult      Flowsheet Row ED from 12/09/2023 in [facility] Most recent reading at 12/09/2023 11:34 PM ED from 12/09/2023 in [facility] Emergency Department at [facility] Most recent reading at 12/09/2023  3:31 PM ED from 12/09/2023 in [facility] Most recent reading at 12/09/2023 11:33 AM  C-SSRS RISK CATEGORY Low Risk No Risk High Risk       Musculoskeletal  Strength & Muscle Tone: within normal limits Gait & Station: normal Patient leans: N/A  Psychiatric Specialty Exam  Presentation  General Appearance:  Appropriate for Environment; Well Groomed  Eye Contact: Good  Speech: Clear and Coherent; Normal Rate  Speech Volume: Normal  Handedness:No data recorded  Mood and Affect  Mood: Euthymic ("Good")  Affect: Congruent; Appropriate   Thought Process  Thought Processes: Coherent; Goal Directed; Linear  Descriptions of Associations:Intact  Orientation:Full (Time, Place and Person)  Thought Content:Logical  Diagnosis of Schizophrenia or Schizoaffective disorder in past: No data recorded   Hallucinations:Hallucinations: None  Ideas of Reference:None  Suicidal Thoughts:Suicidal Thoughts: No  Homicidal Thoughts:Homicidal Thoughts: No   Sensorium  Memory: Immediate Fair; Recent Fair; Remote Fair  Judgment: Good  Insight: Good   Executive Functions  Concentration: Fair  Attention Span: Fair  Recall: Fair  Fund of Knowledge: Fair  Language: Fair   Psychomotor Activity  Psychomotor Activity: Psychomotor Activity: Normal   Assets  Assets: Communication Skills;  Desire for Improvement; Social Support; Resilience; Transportation   Sleep  Sleep: Sleep: Good   No data recorded  Physical Exam  Physical Exam ROS Blood pressure 123/84, pulse 79, temperature 98.1 F (36.7 C), temperature source Oral, resp. rate 18, SpO2 98%. There is no height or weight on file to calculate BMI.  Demographic Factors:  Male and Caucasian  Loss  Factors: Decline in physical health  Historical Factors: NA  Risk Reduction Factors:   Positive social support, Positive therapeutic relationship, and Positive coping skills or problem solving skills  Continued Clinical Symptoms:  Alcohol /Substance Abuse/Dependencies  Cognitive Features That Contribute To Risk:  None    Suicide Risk:  Minimal: No identifiable suicidal ideation.  Patients presenting with no risk factors but with morbid ruminations; may be classified as minimal risk based on the severity of the depressive symptoms  Plan Of Care/Follow-up recommendations:  Activity:  as tolerated Diet:  consistent carbohydrate Prescriptions for new medications provided for the patient to bridge to follow up appointment. The patient was informed that refills for these prescriptions are generally not provided, and patient is encouraged to attend all follow up appointments to address medication refills and adjustments.   Today's discharge was reviewed with treatment team, and the team is in agreement that the patient is ready for discharge. The patient is was of the discharge plan for today and has been given opportunity to ask questions. At time of discharge, the patient does not vocalize any acute harm to self or others, is goal directed, able to advocate for self and organizational baseline.   At discharge, the patient is instructed to:  Take all medications as prescribed. Report any adverse effects and or reactions from the medicines to her outpatient provider promptly.  Do not engage in alcohol  and/or illegal drug use while on prescription medicines.  In the event of worsening symptoms, patient is instructed to call the crisis hotline, 911 and or go to the nearest ED for appropriate evaluation and treatment of symptoms.  Follow-up with primary care provider for further care of medical issues, concerns and or health care needs. * Substance abuse follow up: it is recommended that you  follow up with community support treatment, like AA/NA. It is also recommended that the patient attend 90 meetings in 90 days, otherwise known as "90 in 90"  Disposition: to rehab at [facility]   Floyce Hutching, MD 12/14/2023, 8:13 AM

## 2023-12-14 NOTE — ED Notes (Signed)
 Patient A&O x 4, ambulatory. Patient discharged in no acute distress. Patient denied SI/HI, A/VH upon discharge. Patient verbalized understanding of all discharge instructions reviewed on AVS via staff, to include follow up appointments, RX's and safety. Suicide safety plan completed and reviewed with Clinical research associate. A copy given to pt. Patient reported mood 10/10.  Pt belongings returned to patient from locker #6 intact. Patient escorted to lobby via staff for transport to home. Safety maintained.

## 2023-12-14 NOTE — ED Notes (Signed)
 Patient A&Ox4. Denies intent to harm self/others when asked. Denies A/VH. Patient denies any physical complaints when asked. No acute distress noted. Support and encouragement provided. Routine safety checks conducted according to facility protocol. Encouraged patient to notify staff if thoughts of harm toward self or others arise. Patient verbalize understanding and agreement. Will continue to monitor for safety.
